# Patient Record
Sex: Female | Born: 1980 | Race: Asian | Hispanic: No | Marital: Married | Smoking: Former smoker
Health system: Southern US, Community
[De-identification: ages and names within clinical notes are randomized; demographics above are authoritative.]

## PROBLEM LIST (undated history)

## (undated) ENCOUNTER — Inpatient Hospital Stay (HOSPITAL_COMMUNITY): Payer: Self-pay

## (undated) DIAGNOSIS — F419 Anxiety disorder, unspecified: Secondary | ICD-10-CM

## (undated) DIAGNOSIS — Z8619 Personal history of other infectious and parasitic diseases: Secondary | ICD-10-CM

## (undated) DIAGNOSIS — R87619 Unspecified abnormal cytological findings in specimens from cervix uteri: Secondary | ICD-10-CM

## (undated) DIAGNOSIS — O139 Gestational [pregnancy-induced] hypertension without significant proteinuria, unspecified trimester: Secondary | ICD-10-CM

## (undated) HISTORY — DX: Gestational (pregnancy-induced) hypertension without significant proteinuria, unspecified trimester: O13.9

## (undated) HISTORY — DX: Anxiety disorder, unspecified: F41.9

---
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
## 1999-06-02 ENCOUNTER — Emergency Department (HOSPITAL_COMMUNITY): Admission: EM | Admit: 1999-06-02 | Discharge: 1999-06-02 | Payer: Self-pay | Admitting: Emergency Medicine

## 2002-05-25 ENCOUNTER — Inpatient Hospital Stay (HOSPITAL_COMMUNITY): Admission: AD | Admit: 2002-05-25 | Discharge: 2002-05-25 | Payer: Self-pay | Admitting: Family Medicine

## 2002-06-07 DIAGNOSIS — O139 Gestational [pregnancy-induced] hypertension without significant proteinuria, unspecified trimester: Secondary | ICD-10-CM

## 2002-06-07 HISTORY — PX: OOPHORECTOMY: SHX86

## 2002-06-07 HISTORY — DX: Gestational (pregnancy-induced) hypertension without significant proteinuria, unspecified trimester: O13.9

## 2002-07-18 ENCOUNTER — Ambulatory Visit (HOSPITAL_COMMUNITY): Admission: RE | Admit: 2002-07-18 | Discharge: 2002-07-18 | Payer: Self-pay | Admitting: *Deleted

## 2002-09-22 ENCOUNTER — Inpatient Hospital Stay (HOSPITAL_COMMUNITY): Admission: AD | Admit: 2002-09-22 | Discharge: 2002-09-22 | Payer: Self-pay | Admitting: *Deleted

## 2002-10-02 ENCOUNTER — Inpatient Hospital Stay (HOSPITAL_COMMUNITY): Admission: AD | Admit: 2002-10-02 | Discharge: 2002-10-02 | Payer: Self-pay | Admitting: Obstetrics and Gynecology

## 2002-10-08 ENCOUNTER — Encounter (INDEPENDENT_AMBULATORY_CARE_PROVIDER_SITE_OTHER): Payer: Self-pay | Admitting: Specialist

## 2002-10-08 ENCOUNTER — Encounter: Payer: Self-pay | Admitting: Obstetrics and Gynecology

## 2002-10-09 ENCOUNTER — Inpatient Hospital Stay (HOSPITAL_COMMUNITY): Admission: AD | Admit: 2002-10-09 | Discharge: 2002-10-11 | Payer: Self-pay | Admitting: Obstetrics and Gynecology

## 2002-10-12 ENCOUNTER — Inpatient Hospital Stay (HOSPITAL_COMMUNITY): Admission: AD | Admit: 2002-10-12 | Discharge: 2002-10-12 | Payer: Self-pay | Admitting: Family Medicine

## 2002-10-15 ENCOUNTER — Inpatient Hospital Stay (HOSPITAL_COMMUNITY): Admission: AD | Admit: 2002-10-15 | Discharge: 2002-10-15 | Payer: Self-pay | Admitting: Family Medicine

## 2002-11-06 ENCOUNTER — Encounter: Payer: Self-pay | Admitting: *Deleted

## 2002-11-06 ENCOUNTER — Inpatient Hospital Stay (HOSPITAL_COMMUNITY): Admission: AD | Admit: 2002-11-06 | Discharge: 2002-11-06 | Payer: Self-pay | Admitting: *Deleted

## 2002-11-07 ENCOUNTER — Inpatient Hospital Stay (HOSPITAL_COMMUNITY): Admission: AD | Admit: 2002-11-07 | Discharge: 2002-11-10 | Payer: Self-pay | Admitting: Obstetrics and Gynecology

## 2003-04-24 ENCOUNTER — Emergency Department (HOSPITAL_COMMUNITY): Admission: EM | Admit: 2003-04-24 | Discharge: 2003-04-24 | Payer: Self-pay | Admitting: Emergency Medicine

## 2003-07-20 ENCOUNTER — Emergency Department (HOSPITAL_COMMUNITY): Admission: EM | Admit: 2003-07-20 | Discharge: 2003-07-20 | Payer: Self-pay | Admitting: Emergency Medicine

## 2003-07-25 ENCOUNTER — Inpatient Hospital Stay (HOSPITAL_COMMUNITY): Admission: AD | Admit: 2003-07-25 | Discharge: 2003-07-25 | Payer: Self-pay | Admitting: Obstetrics & Gynecology

## 2003-08-27 ENCOUNTER — Inpatient Hospital Stay (HOSPITAL_COMMUNITY): Admission: AD | Admit: 2003-08-27 | Discharge: 2003-08-27 | Payer: Self-pay | Admitting: Family Medicine

## 2003-08-28 ENCOUNTER — Inpatient Hospital Stay (HOSPITAL_COMMUNITY): Admission: AD | Admit: 2003-08-28 | Discharge: 2003-08-28 | Payer: Self-pay | Admitting: Obstetrics and Gynecology

## 2003-09-18 ENCOUNTER — Inpatient Hospital Stay (HOSPITAL_COMMUNITY): Admission: AD | Admit: 2003-09-18 | Discharge: 2003-09-18 | Payer: Self-pay | Admitting: Obstetrics and Gynecology

## 2004-11-30 ENCOUNTER — Other Ambulatory Visit: Admission: RE | Admit: 2004-11-30 | Discharge: 2004-11-30 | Payer: Self-pay | Admitting: Obstetrics and Gynecology

## 2006-04-25 ENCOUNTER — Other Ambulatory Visit: Admission: RE | Admit: 2006-04-25 | Discharge: 2006-04-25 | Payer: Self-pay | Admitting: Obstetrics and Gynecology

## 2009-06-26 ENCOUNTER — Emergency Department (HOSPITAL_COMMUNITY): Admission: EM | Admit: 2009-06-26 | Discharge: 2009-06-26 | Payer: Self-pay | Admitting: Emergency Medicine

## 2010-08-23 LAB — URINALYSIS, ROUTINE W REFLEX MICROSCOPIC
Glucose, UA: NEGATIVE mg/dL
Hgb urine dipstick: NEGATIVE
Specific Gravity, Urine: 1.024 (ref 1.005–1.030)

## 2010-08-23 LAB — URINE MICROSCOPIC-ADD ON

## 2010-08-23 LAB — RPR: RPR Ser Ql: NONREACTIVE

## 2010-08-23 LAB — WET PREP, GENITAL: Yeast Wet Prep HPF POC: NONE SEEN

## 2010-10-23 NOTE — Discharge Summary (Signed)
NAMEGRACEYN, FODOR                             ACCOUNT NO.:  [id number]   MEDICAL RECORD NO.:  [id number]                   PATIENT TYPE:  INP   LOCATION:  9157                                 FACILITY:  [facility]   PHYSICIAN:  Phil D. Okey Dupre, M.D.                  DATE OF BIRTH:  [birth year]   DATE OF ADMISSION:  10/08/2002  DATE OF DISCHARGE:  10/11/2002                                 DISCHARGE SUMMARY   DISCHARGE DIAGNOSES:  1. Torsed right ovary with ovarian cysts.  2. Intrauterine pregnancy at 86 and 0/7 weeks.   DISCHARGE MEDICATIONS:  1. Percocet 5/325 one tablet p.o. q.4h. p.r.n. for pain.  2. Tylenol for mild to moderate pain.  3. Colace 100 mg p.o. daily.   DISPOSITION:  The patient was discharged home in stable condition.   FOLLOW UP:  1. Oct 12, 2002 patient to present to MAU to be monitored for one hour for     uterine contractions.  2. Oct 15, 2002 patient to present to MAU for staple removal.  3. The patient to call [facility] for follow-up appointment within two     weeks of discharge date.   HISTORY AND PHYSICAL:  The patient is a 30 year old G1, P0 who presented at  31 and 4/7 weeks dated by LMP complaining of constant right lower quadrant  pain.  The patient had vomiting x1, no dysuria, history of treated bacterial  vaginosis at the end of December.  She has been following at [facility]  beginning at 19 weeks for her prenatal care.   PHYSICAL EXAMINATION:  VITAL SIGNS:  Blood pressure 132/76.  GENERAL:  The patient was in severe discomfort.  ABDOMEN:  Soft with tenderness.  Unclear bowel sounds.  PELVIC:  Speculum examination showed small amount of thick white discharge  without blood.  Cervix on digital examination was posterior, closed, and  effaced to approximately 50%.  Fetal heart rate was in the 130s.  Tocolysis  strip showed uterine irritability.   LABORATORIES:  Wet prep that showed few white blood cells and few bacteria.  Ultrasound showed a  torsed right ovary without flow.   HOSPITAL COURSE:  Problem 1 - TORSION OF RIGHT OVARIAN CYST:  The patient  was taken for exploratory laparotomy by Javier Glazier. Okey Dupre, M.D.  Please see  dictated note for details.  The patient had a right oophorectomy.  Postoperative course was uncomplicated.  The patient was monitored for three  days for uterine contractions or other signs/symptoms of labor.  The patient  continued to show mild uterine irritability with occasional real contraction  on tocolysis strip that was stable throughout observation.  The patient is  to follow in 24 hours after the time of discharge for further monitoring.  Incision was clean, dry, and intact at the time of discharge.  The patient  to represent on May 10  for staple removal.   Problem 2 - INTRAUTERINE PREGNANCY AT 32 WEEKS AT THE TIME OF DISCHARGE:  Fetal external monitoring showed a reassuring tracing throughout the  patient's admission.  The patient to continue to follow at [facility]  for prenatal care.  As there are no signs and symptoms of labor, patient was  not placed on any tocolytics but plans were made to monitor the patient  intermittently.     Barney Drain, M.D.                       Phil D. Okey Dupre, M.D.    SS/MEDQ  D:  10/15/2002  T:  10/16/2002  Job:  [id number]

## 2010-10-23 NOTE — Op Note (Signed)
Courtney Woodward, Courtney Woodward                             ACCOUNT NO.:  [id number]   MEDICAL RECORD NO.:  [id number]                   PATIENT TYPE:  AMB   LOCATION:  SDC                                  [facility]  [facility]   PHYSICIAN:  Phil D. Okey Dupre, M.D.                  DATE OF BIRTH:  [birth year]   DATE OF PROCEDURE:  10/08/2002  DATE OF DISCHARGE:                                 OPERATIVE REPORT   PROCEDURE:  Exploratory laparotomy and right oophorectomy.   PREOPERATIVE DIAGNOSIS:  Right ovarian cyst torsion, 31+ weeks pregnant.   POSTOPERATIVE DIAGNOSIS:  Right ovarian cyst torsion, 31+ weeks pregnant.   SURGEON:  Javier Glazier. Okey Dupre, M.D.   ANESTHESIA:  General.   OPERATIVE FINDINGS:  A 6 x 6 x 6 very tense right ovarian cyst with a  torsion at the base.   DESCRIPTION OF PROCEDURE:  Under satisfactory general anesthesia the patient  with the patient in the dorsal supine position the abdomen was prepped and  draped in the usual sterile manner with a Foley catheter in the urinary  bladder.  The abdomen was entered through a midline subumbilical incision  situated just below the umbilicus and extending caudad for approximately 8  cm.  The abdomen was entered by layers.  Entering the peritoneal cavity we  immediately reached for the right adnexa and this was as described above,  and just exteriorizing it through the incision the tension of the cyst broke  and the cyst deflated with amber serous fluid extruded from the cyst.  In  looking at the base of the cyst it appeared not to be viable, and in this  patient who was already pregnant we felt that it would be too much of a risk  to repair this cyst and leave nonviable tissue in there.  Heaney clamps were  placed across the base of the cyst, making sure to stay free of the  fallopian tube on that side and the ovary was then removed and the pedicles  ligated doubly with suture ligature of 0 Vicryl.  The area was observed for  bleeding; none was  noted and the pack was replaced into the peritoneal  cavity and the incision closed fascia through peritoneum with 0 Vicryl  alternating locked suture.  Subcutaneous approximation was carried out with  2-0 plain catgut suture and skin edges approximated with skin staples.  Subcutaneous bleeders were controlled with cautery.  Dry sterile dressing  was applied.  The patient was transferred to recovery room in satisfactory  condition with a minimal blood loss of less than 20 mL.  Tape, instrument,  sponge, and needle count reported correct at the end of the procedure.  Phil D. Okey Dupre, M.D.    PDR/MEDQ  D:  10/08/2002  T:  10/08/2002  Job:  [id number]

## 2010-10-23 NOTE — Discharge Summary (Signed)
Courtney Woodward, Courtney Woodward                             ACCOUNT NO.:  [id number]   MEDICAL RECORD NO.:  [id number]                   PATIENT TYPE:  INP   LOCATION:  9106                                 [facility]ILITY:  [facility]   PHYSICIAN:  Rodolph Bong, M.D.                  DATE OF BIRTH:  1980/07/12   DATE OF ADMISSION:  11/07/2002  DATE OF DISCHARGE:  11/10/2002                                 DISCHARGE SUMMARY   DISCHARGE DIAGNOSES:  1. Normal spontaneous vaginal delivery of an intrauterine pregnancy at 35-     5/7 weeks.  2. Pre-eclampsia.   DISCHARGE MEDICATIONS:  1. Ibuprofen 600 mg p.o. every 6 hours p.r.n.  2. Prenatal vitamins one tablet p.o. q.d. for six weeks.  3. Depo-Provera 150 mg IM injection x1. The patient instructed to pursue     each 3 months for continued birth control.   HISTORY OF PRESENT ILLNESS:  This is a 30 year old G1, P0 who presented at  35-6/7 weeks with complaints of vomiting and questionable rupture of  membranes early this morning. She was found to be pre-eclamptic with a uric  acid level of 5.9 and blood pressure elevation to 160's over 90's.  Consequently, she was admitted to antepartum for further evaluation and  management.   HOSPITAL COURSE:  1. Obstetrics. The patient was admitted to the antepartum for further     evaluation. She was started on magnesium sulfate therapy. Over the next     few hours, she continued to have contractions which increased in     frequency. She was found to have cervical dilatation as well. At 2230 on     November 07, 2002 the patient delivered a viable female infant by SVD. She had     had complete vaginal dilatation and there were some fetal variable     decelerations in the 60's with heart rate to 100 to 120's. She had a     primary second degree laceration that did require suturing with 3-0     Vicryl. The baby did have a nuchal cord x1. Cord clamped and the baby     passed to the NICU team for evaluation. There were no other  complications. The baby did have Apgar's of 8 at one minute and 9 at five     minutes. Throughout the remainder of her hospital stay, [name] did     well. She was maintained on magnesium sulfate therapy for approximately     24 hours post delivery. Then as she began diuresing, the magnesium     therapy was stopped. She was monitored for another 24 hours without any     further complications. Her blood pressure had trended down and was in the     120 over 60's at discharge.   LABORATORY DATA:  Discharge labs, hemoglobin of 11.6, hematocrit 34.5 on  November 08, 2002.  DISCHARGE INSTRUCTIONS:  Sexual activity, the patient was advised for no  sexual intercourse for six weeks.   FOLLOW UP:  The patient advised to return to [facility] in six weeks for  further evaluation.                                               Rodolph Bong, M.D.    AK/MEDQ  D:  11/10/2002  T:  11/10/2002  Job:  [id number]

## 2015-03-21 ENCOUNTER — Ambulatory Visit (INDEPENDENT_AMBULATORY_CARE_PROVIDER_SITE_OTHER): Payer: BLUE CROSS/BLUE SHIELD | Admitting: Obstetrics and Gynecology

## 2015-03-21 ENCOUNTER — Encounter: Payer: Self-pay | Admitting: Obstetrics and Gynecology

## 2015-03-21 VITALS — BP 108/70 | HR 60 | Resp 18 | Ht 68.0 in | Wt 258.8 lb

## 2015-03-21 DIAGNOSIS — N760 Acute vaginitis: Secondary | ICD-10-CM

## 2015-03-21 NOTE — Addendum Note (Signed)
Addended by: Ardell IsaacsAMUNDSON C SILVA, BROOK E on: 03/21/2015 10:49 AM   Modules accepted: Orders

## 2015-03-21 NOTE — Progress Notes (Signed)
Patient ID: Courtney Woodward, female   DOB: July 13, 1980, 34 y.o.   MRN: [id number] 34 y.o. G3P0 Married [place] female here for vaginal discharge with odor. Patient states she has been treated several times with Metronidazole by Urgent Care, but continues to have symptoms. Symptoms off and on for 6 months.  Having clear white discharge with and without odor.  No itching or burning.  Feels like she has never been symptoms free.  Taking cranberry pill and probiotics.   Not helping.  Stopped douching on year ago.  No change in partner.  Uses condoms some of the time.  Not using any gels, creams or lotions.   Had GC/CT and full STD testing which was negative.   Did Metrogel twice.  Took Flagyl 2 weeks ago and a couple of months ago.   Menses regular and normal.    PCP:   None  Patient's last menstrual period was 02/25/2015 (exact date).          Sexually active: Yes.   female The current method of family planning is condoms sometimes.    Exercising: Yes.    works out at gym 3-4x/week. Smoker:  Former, quit in 2003  Health Maintenance: Pap:  08-30-14 normal per patient History of abnormal Pap:  no MMG:  n/a Colonoscopy:  n/a BMD:   n/a  Result  n/a TDaP:  UNSURE     reports that she quit smoking about 13 years ago. She does not have any smokeless tobacco history on file. She reports that she drinks alcohol. She reports that she does not use illicit drugs.  Past Medical History  Diagnosis Date  . Anxiety   . PIH (pregnancy induced hypertension) 2004    Past Surgical History  Procedure Laterality Date  . Oophorectomy  2004    Rt. ovary removed--Hemorrhagic Corpus Luteum Cyst    Current Outpatient Prescriptions  Medication Sig Dispense Refill  . triamcinolone (KENALOG) 0.025 % cream apply to FACE twice a day if needed  0   No current facility-administered medications for this visit.    Family History  Problem Relation Age of Onset  . Stroke Mother     Hx of 3 strokes  .  Cancer Mother     Dec unknown Cancer age 75  . Diabetes Mother   . Hyperlipidemia Mother     ROS:  Pertinent items are noted in HPI.  Otherwise, a comprehensive ROS was negative.  Exam:   BP 108/70 mmHg  Pulse 60  Resp 18  Ht  (1.727 m)  Wt 258 lb 12.8 oz (117.391 kg)  BMI 39.36 kg/m2  LMP 02/25/2015 (Exact Date)    General appearance: alert, cooperative and appears stated age   Abdomen: soft, non-tender; bowel sounds normal; no masses,  no organomegaly    Pelvic: External genitalia:  no lesions              Urethra:  normal appearing urethra with no masses, tenderness or lesions              Bartholins and Skenes: normal                 Vagina: normal appearing vagina with normal color and discharge, no lesions              Cervix: no lesions         Bimanual Exam:  Uterus:  normal size, contour, position, consistency, mobility, non-tender  Adnexa: normal adnexa and no mass, fullness, tenderness               Chaperone was present for exam.  Assessment:     Recurrent vaginitis.  Hx bacterial vaginosis.   Plan:   Affirm testing performed.  Discussion of bacterial vaginosis, risk factors, acute treatment options (may do Tindamax this time if needed) and long term treatment options - boric acid vaginal suppositories or Metrogel. Written materials on vaginitis also provided.  After visit summary provided.   ___20____ minutes face to face time of which over 50% was spent in counseling.

## 2015-03-21 NOTE — Patient Instructions (Signed)
Bacterial Vaginosis °Bacterial vaginosis is a vaginal infection that occurs when the normal balance of bacteria in the vagina is disrupted. It results from an overgrowth of certain bacteria. This is the most common vaginal infection in women of childbearing age. Treatment is important to prevent complications, especially in pregnant women, as it can cause a premature delivery. °CAUSES  °Bacterial vaginosis is caused by an increase in harmful bacteria that are normally present in smaller amounts in the vagina. Several different kinds of bacteria can cause bacterial vaginosis. However, the reason that the condition develops is not fully understood. °RISK FACTORS °Certain activities or behaviors can put you at an increased risk of developing bacterial vaginosis, including: °· Having a new sex partner or multiple sex partners. °· Douching. °· Using an intrauterine device (IUD) for contraception. °Women do not get bacterial vaginosis from toilet seats, bedding, swimming pools, or contact with objects around them. °SIGNS AND SYMPTOMS  °Some women with bacterial vaginosis have no signs or symptoms. Common symptoms include: °· Grey vaginal discharge. °· A fishlike odor with discharge, especially after sexual intercourse. °· Itching or burning of the vagina and vulva. °· Burning or pain with urination. °DIAGNOSIS  °Your health care provider will take a medical history and examine the vagina for signs of bacterial vaginosis. A sample of vaginal fluid may be taken. Your health care provider will look at this sample under a microscope to check for bacteria and abnormal cells. A vaginal pH test may also be done.  °TREATMENT  °Bacterial vaginosis may be treated with antibiotic medicines. These may be given in the form of a pill or a vaginal cream. A second round of antibiotics may be prescribed if the condition comes back after treatment. Because bacterial vaginosis increases your risk for sexually transmitted diseases, getting  treated can help reduce your risk for chlamydia, gonorrhea, HIV, and herpes. °HOME CARE INSTRUCTIONS  °· Only take over-the-counter or prescription medicines as directed by your health care provider. °· If antibiotic medicine was prescribed, take it as directed. Make sure you finish it even if you start to feel better. °· Tell all sexual partners that you have a vaginal infection. They should see their health care provider and be treated if they have problems, such as a mild rash or itching. °· During treatment, it is important that you follow these instructions: °¨ Avoid sexual activity or use condoms correctly. °¨ Do not douche. °¨ Avoid alcohol as directed by your health care provider. °¨ Avoid breastfeeding as directed by your health care provider. °SEEK MEDICAL CARE IF:  °· Your symptoms are not improving after 3 days of treatment. °· You have increased discharge or pain. °· You have a fever. °MAKE SURE YOU:  °· Understand these instructions. °· Will watch your condition. °· Will get help right away if you are not doing well or get worse. °FOR MORE INFORMATION  °Centers for Disease Control and Prevention, Division of STD Prevention: www.cdc.gov/std °American Sexual Health Association (ASHA): www.ashastd.org  °  °This information is not intended to replace advice given to you by your health care provider. Make sure you discuss any questions you have with your health care provider. °  °Document Released: 05/24/2005 Document Revised: 06/14/2014 Document Reviewed: 01/03/2013 °Elsevier Interactive Patient Education ©2016 Elsevier Inc. ° ° ° °Vaginitis °Vaginitis is an inflammation of the vagina. It is most often caused by a change in the normal balance of the bacteria and yeast that live in the vagina. This change in balance   causes an overgrowth of certain bacteria or yeast, which causes the inflammation. There are different types of vaginitis, but the most common types are: °· Bacterial vaginosis. °· Yeast  infection (candidiasis). °· Trichomoniasis vaginitis. This is a sexually transmitted infection (STI). °· Viral vaginitis. °· Atrophic vaginitis. °· Allergic vaginitis. °CAUSES  °The cause depends on the type of vaginitis. Vaginitis can be caused by: °· Bacteria (bacterial vaginosis). °· Yeast (yeast infection). °· A parasite (trichomoniasis vaginitis) °· A virus (viral vaginitis). °· Low hormone levels (atrophic vaginitis). Low hormone levels can occur during pregnancy, breastfeeding, or after menopause. °· Irritants, such as bubble baths, scented tampons, and feminine sprays (allergic vaginitis). °Other factors can change the normal balance of the yeast and bacteria that live in the vagina. These include: °· Antibiotic medicines. °· Poor hygiene. °· Diaphragms, vaginal sponges, spermicides, birth control pills, and intrauterine devices (IUD). °· Sexual intercourse. °· Infection. °· Uncontrolled diabetes. °· A weakened immune system. °SYMPTOMS  °Symptoms can vary depending on the cause of the vaginitis. Common symptoms include: °· Abnormal vaginal discharge. °¨ The discharge is white, gray, or yellow with bacterial vaginosis. °¨ The discharge is thick, white, and cheesy with a yeast infection. °¨ The discharge is frothy and yellow or greenish with trichomoniasis. °· A bad vaginal odor. °¨ The odor is fishy with bacterial vaginosis. °· Vaginal itching, pain, or swelling. °· Painful intercourse. °· Pain or burning when urinating. °Sometimes, there are no symptoms. °TREATMENT  °Treatment will vary depending on the type of infection.  °· Bacterial vaginosis and trichomoniasis are often treated with antibiotic creams or pills. °· Yeast infections are often treated with antifungal medicines, such as vaginal creams or suppositories. °· Viral vaginitis has no cure, but symptoms can be treated with medicines that relieve discomfort. Your sexual partner should be treated as well. °· Atrophic vaginitis may be treated with an  estrogen cream, pill, suppository, or vaginal ring. If vaginal dryness occurs, lubricants and moisturizing creams may help. You may be told to avoid scented soaps, sprays, or douches. °· Allergic vaginitis treatment involves quitting the use of the product that is causing the problem. Vaginal creams can be used to treat the symptoms. °HOME CARE INSTRUCTIONS  °· Take all medicines as directed by your caregiver. °· Keep your genital area clean and dry. Avoid soap and only rinse the area with water. °· Avoid douching. It can remove the healthy bacteria in the vagina. °· Do not use tampons or have sexual intercourse until your vaginitis has been treated. Use sanitary pads while you have vaginitis. °· Wipe from front to back. This avoids the spread of bacteria from the rectum to the vagina. °· Let air reach your genital area. °¨ Wear cotton underwear to decrease moisture buildup. °¨ Avoid wearing underwear while you sleep until your vaginitis is gone. °¨ Avoid tight pants and underwear or nylons without a cotton panel. °¨ Take off wet clothing (especially bathing suits) as soon as possible. °· Use mild, non-scented products. Avoid using irritants, such as: °¨ Scented feminine sprays. °¨ Fabric softeners. °¨ Scented detergents. °¨ Scented tampons. °¨ Scented soaps or bubble baths. °· Practice safe sex and use condoms. Condoms may prevent the spread of trichomoniasis and viral vaginitis. °SEEK MEDICAL CARE IF:  °· You have abdominal pain. °· You have a fever or persistent symptoms for more than 2-3 days. °· You have a fever and your symptoms suddenly get worse. °  °This information is not intended to replace advice given   to you by your health care provider. Make sure you discuss any questions you have with your health care provider. °  °Document Released: 03/21/2007 Document Revised: 10/08/2014 Document Reviewed: 11/04/2011 °Elsevier Interactive Patient Education ©2016 Elsevier Inc. ° °

## 2015-03-22 LAB — WET PREP BY MOLECULAR PROBE
Candida species: POSITIVE — AB
GARDNERELLA VAGINALIS: POSITIVE — AB
TRICHOMONAS VAG: NEGATIVE

## 2015-03-25 ENCOUNTER — Telehealth: Payer: Self-pay | Admitting: Obstetrics and Gynecology

## 2015-03-25 MED ORDER — FLUCONAZOLE 150 MG PO TABS: 150.0000 mg | ORAL_TABLET | Freq: Every day | ORAL | Status: DC

## 2015-03-25 MED ORDER — TINIDAZOLE 500 MG PO TABS: 500.0000 mg | ORAL_TABLET | Freq: Two times a day (BID) | ORAL | Status: DC

## 2015-03-25 MED ORDER — NONFORMULARY OR COMPOUNDED ITEM: Status: DC

## 2015-03-25 NOTE — Telephone Encounter (Signed)
Spoke with patient. Advised of results and message as seen below from Dr.Silva. Patient is agreeable. Rx for Diflucan 150 mg #2 0RF and Tindamax 500 mg #10 [facility]RF [facility] patient request. Rx for Boric Acid suppositories 600 mg per vagina 21 days #12 0RF [facility].   Notes Recorded by Patton SallesBrook E Amundson C Silva, MD on 03/22/2015 at 1:30 PM Please report results to patient showing both yeast vaginitis and bacterial vaginosis.  I am recommending patient take: Diflucan 150 mg po. Take 1 and repeat in 48 hours prn. #2, RF zero.  Tindamax 500 mg tablets. Take 2 tablets (1000 mg) po q day for 5 days. #10, RF zero.  Following treatmentof the yeast and bacterial vaginosis, patient may use boric acid vaginal suppositories 600 mg per vagina for 21 days. #21, RF zero. This will need to go to compounding pharmacy [facility]. Follow up prn.   Cc- Claudette LawsAmanda Dixon  Routing to provider for final review. Patient agreeable to disposition. Will close encounter.

## 2015-03-25 NOTE — Telephone Encounter (Signed)
Patient calling for results.

## 2017-11-05 DIAGNOSIS — R87619 Unspecified abnormal cytological findings in specimens from cervix uteri: Secondary | ICD-10-CM

## 2017-11-05 HISTORY — DX: Unspecified abnormal cytological findings in specimens from cervix uteri: R87.619

## 2017-11-05 HISTORY — PX: LEEP: SHX91

## 2017-12-05 DIAGNOSIS — Z8619 Personal history of other infectious and parasitic diseases: Secondary | ICD-10-CM

## 2017-12-05 HISTORY — DX: Personal history of other infectious and parasitic diseases: Z86.19

## 2018-01-05 ENCOUNTER — Emergency Department (HOSPITAL_COMMUNITY)
Admission: EM | Admit: 2018-01-05 | Discharge: 2018-01-06 | Disposition: A | Discharge status: Home / Self Care | Payer: BLUE CROSS/BLUE SHIELD | Attending: Emergency Medicine | Admitting: Emergency Medicine

## 2018-01-05 ENCOUNTER — Emergency Department (HOSPITAL_COMMUNITY): Payer: BLUE CROSS/BLUE SHIELD

## 2018-01-05 ENCOUNTER — Encounter (HOSPITAL_COMMUNITY): Payer: Self-pay | Admitting: Emergency Medicine

## 2018-01-05 DIAGNOSIS — Z87891 Personal history of nicotine dependence: Secondary | ICD-10-CM | POA: Diagnosis not present

## 2018-01-05 DIAGNOSIS — R079 Chest pain, unspecified: Secondary | ICD-10-CM | POA: Insufficient documentation

## 2018-01-05 DIAGNOSIS — R0602 Shortness of breath: Secondary | ICD-10-CM | POA: Diagnosis not present

## 2018-01-05 LAB — BASIC METABOLIC PANEL
ANION GAP: 9 (ref 5–15)
BUN: 22 mg/dL — ABNORMAL HIGH (ref 6–20)
CHLORIDE: 109 mmol/L (ref 98–111)
CO2: 24 mmol/L (ref 22–32)
Calcium: 9 mg/dL (ref 8.9–10.3)
Creatinine, Ser: 0.72 mg/dL (ref 0.44–1.00)
GFR calc non Af Amer: >60 mL/min (ref >60)
GLUCOSE: 95 mg/dL (ref 70–99)
Potassium: 3.9 mmol/L (ref 3.5–5.1)
Sodium: 142 mmol/L (ref 135–145)

## 2018-01-05 LAB — I-STAT TROPONIN, ED
TROPONIN I, POC: 0.01 ng/mL (ref 0.00–0.08)
Troponin i, poc: 0 ng/mL (ref 0.00–0.08)

## 2018-01-05 LAB — I-STAT BETA HCG BLOOD, ED (MC, WL, AP ONLY): I-stat hCG, quantitative: <5 m[IU]/mL (ref <5)

## 2018-01-05 LAB — CBC
HCT: 39.7 % (ref 36.0–46.0)
Hemoglobin: 13.2 g/dL (ref 12.0–15.0)
MCH: 30.2 pg (ref 26.0–34.0)
MCHC: 33.2 g/dL (ref 30.0–36.0)
MCV: 90.8 fL (ref 78.0–100.0)
Platelets: 277 10*3/uL (ref 150–400)
RBC: 4.37 MIL/uL (ref 3.87–5.11)
RDW: 13.5 % (ref 11.5–15.5)
WBC: 9.8 10*3/uL (ref 4.0–10.5)

## 2018-01-05 MED ORDER — GI COCKTAIL ~~LOC~~
30.0000 mL | Freq: Once | ORAL | Status: AC
  Administered 2018-01-05: 30 mL via ORAL
  Filled 2018-01-05: qty 30

## 2018-01-05 NOTE — ED Provider Notes (Signed)
[facility]-EMERGENCY DEPT Provider Note   CSN: [id number] Arrival date & time: 01/05/18  1946     History   Chief Complaint Chief Complaint  Patient presents with  . Chest Pain    HPI Courtney Woodward is a 37 y.o. female presenting for evaluation of chest pain.  Patient states she was working out and drink an energy drink.  She continued to work out, when she had acute onset of chest pain or shortness of breath.  By the time she got to the hospital, her symptoms had mostly resolved.  States she had a similar episode in March after she had been drinking a significant amount of alcohol.  She denies recent alcohol use.  She states pain was in her central chest, describes it as a tightness.  It did not radiate.  She denies associated diaphoresis, nausea, or vomiting.  She has no medical problems, no history of diabetes, hypertension, hyperlipidemia.  No history of cardiac or pulmonary problems in the past.  She recently started Flagyl for GU infections, has not had any side effects with this including vomiting or diarrhea.  Symptoms are not worse with exertion or inspiration.  They have continued to improve as time has passed.  Currently pain-free.  HPI  Past Medical History:  Diagnosis Date  . Anxiety   . PIH (pregnancy induced hypertension) 2004    There are no active problems to display for this patient.   Past Surgical History:  Procedure Laterality Date  . OOPHORECTOMY  2004   Rt. ovary removed--Hemorrhagic Corpus Luteum Cyst     OB History    Gravida  3   Para  2   Term  2   Preterm  0   AB  1   Living  2     SAB  0   TAB  1   Ectopic  0   Multiple  1   Live Births  2            Home Medications    Prior to Admission medications   Medication Sig Start Date End Date Taking? Authorizing Provider  metroNIDAZOLE (FLAGYL) 500 MG tablet Take 500 mg by mouth 2 (two) times daily.   Yes [provider]  fluconazole (DIFLUCAN)  150 MG tablet Take 1 tablet (150 mg total) by mouth daily. Repeat in 48 hours. Patient not taking: Reported on 01/05/2018 03/25/15   Patton Salles, MD  NONFORMULARY OR COMPOUNDED ITEM Place one boric acid vaginal suppository 600 mg per vagina nightly for 21 days. Patient not taking: Reported on 01/05/2018 03/25/15   Patton Salles, MD  tinidazole (TINDAMAX) 500 MG tablet Take 1 tablet (500 mg total) by mouth 2 (two) times daily. Patient not taking: Reported on 01/05/2018 03/25/15   Patton Salles, MD    Family History Family History  Problem Relation Age of Onset  . Stroke Mother        Hx of 3 strokes  . Cancer Mother        Dec unknown Cancer age 10  . Diabetes Mother   . Hyperlipidemia Mother     Social History Social History   Tobacco Use  . Smoking status: Former Smoker    Last attempt to quit: 06/07/2001    Years since quitting: 16.5  Substance Use Topics  . Alcohol use: Yes    Alcohol/week: 0.0 oz    Comment: only occ.  . Drug use:  No     Allergies   Patient has no known allergies.   Review of Systems Review of Systems  Respiratory: Positive for chest tightness and shortness of breath.   Cardiovascular: Positive for chest pain.  All other systems reviewed and are negative.    Physical Exam Updated Vital Signs BP (!) 142/85   Pulse 63   Temp 98.2 F (36.8 C) (Oral)   Resp (!) 21   Ht 5\' 7"  (1.702 m)   Wt 113.4 kg (250 lb)   LMP 12/12/2017   SpO2 98%   BMI 39.16 kg/m   Physical Exam  Constitutional: She is oriented to person, place, and time. She appears well-developed and well-nourished. No distress.  Appears in no distress  HENT:  Head: Normocephalic and atraumatic.  Eyes: Pupils are equal, round, and reactive to light. Conjunctivae and EOM are normal.  Neck: Normal range of motion. Neck supple.  Cardiovascular: Normal rate, regular rhythm and intact distal pulses.  Pulmonary/Chest: Effort normal and breath sounds  normal. No respiratory distress. She has no wheezes.  Abdominal: Soft. She exhibits no distension and no mass. There is no tenderness. There is no guarding.  Musculoskeletal: Normal range of motion.  No leg pain or swelling.  Pedal pulses intact bilaterally  Neurological: She is alert and oriented to person, place, and time.  Skin: Skin is warm and dry. Capillary refill takes less than 2 seconds.  Psychiatric: She has a normal mood and affect.  Nursing note and vitals reviewed.    ED Treatments / Results  Labs (all labs ordered are listed, but only abnormal results are displayed) Labs Reviewed  BASIC METABOLIC PANEL - Abnormal; Notable for the following components:      Result Value   BUN 22 (*)    All other components within normal limits  CBC  I-STAT TROPONIN, ED  I-STAT BETA HCG BLOOD, ED ([facility], [facility], [facility] ONLY)  I-STAT TROPONIN, ED    EKG EKG Interpretation  Date/Time:  Thursday January 05 2018 20:06:40 EDT Ventricular Rate:  74 PR Interval:    QRS Duration: 93 QT Interval:  413 QTC Calculation: 459 R Axis:   49 Text Interpretation:  Sinus rhythm Low voltage, precordial leads No previous ECGs available Confirmed by Vanetta MuldersZackowski, Scott [phone]([id number]) on 01/05/2018 11:40:44 PM   Radiology Dg Chest 2 View  Result Date: 01/05/2018 CLINICAL DATA:  Mid chest pain. EXAM: CHEST - 2 VIEW COMPARISON:  None. FINDINGS: Mildly enlarged cardiac silhouette. Mediastinal contours appear intact. There is no evidence of focal airspace consolidation, pleural effusion or pneumothorax. Osseous structures are without acute abnormality. Soft tissues are grossly normal. IMPRESSION: Mildly enlarged cardiac silhouette. No evidence of pulmonary edema or consolidation. Electronically Signed   By: Ted Mcalpineobrinka  Dimitrova M.D.   On: 01/05/2018 20:17    Procedures Procedures (including critical care time)  Medications Ordered in ED Medications  gi cocktail (Maalox,Lidocaine,Donnatal) (30 mLs Oral Given 01/05/18 2256)      Initial Impression / Assessment and Plan / ED Course  I have reviewed the triage vital signs and the nursing notes.  Pertinent labs & imaging results that were available during my care of the patient were reviewed by me and considered in my medical decision making (see chart for details).     Pt presented for evaluation of chest pain or shortness of breath.  Physical exam reassuring, she is currently symptom-free.  She appears in no distress.  Initial labs reassuring, troponin negative, EKG without STEMI.  Thus doubt ACS.  Chest x-ray viewed interpreted by me, shows mild cardiomegaly without pneumothorax, pneumonia, or effusions.  Patient without peripheral edema and with resolution of symptoms, doubt new onset CHF.  Labs otherwise reassuring.  Will obtain repeat troponin, try GI cocktail for symptom relief, and reassess.  On reassessment, patient reports complete resolution of her symptoms.  Repeat troponin negative.  Patient is low risk, heart score 0.  Thus doubt ACS, PE, or concerning etiology of chest pain requiring hospitalization.  Discussed findings with patient.  Discussed follow-up with PCP if symptoms return or persist.  Strict return precautions given.  At this time, patient appears safe for discharge.  Patient states she understands and agrees plan.  Final Clinical Impressions(s) / ED Diagnoses   Final diagnoses:  Nonspecific chest pain    ED Discharge Orders    None       Miyo, Aina, PA-C 01/05/18 2344    Vanetta Mulders, MD 01/06/18 [phone]

## 2018-01-05 NOTE — Discharge Instructions (Addendum)
Stop using caffeinated products, including energy drinks, coffee, tea, sodas. Make sure you are staying well-hydrated with water. Follow-up with your primary care doctor if your symptoms continue. Return to the emergency room if you develop any new, worsening, or concerning symptoms.

## 2018-01-05 NOTE — ED Triage Notes (Signed)
Patient reports drinking energy drink approximately 30 minutes ago. Reports central chest pain and SOB that began while working out 15 minutes ago. Denies N/V, pain radiation.

## 2018-01-05 NOTE — ED Notes (Signed)
Pt stated that she drank a bang energy drink and was working out when she suddenly couldn't take a deep breath, she developed hives and was sneezing uncontrollably. She is just c/o some chest tightness currently.

## 2018-03-21 ENCOUNTER — Inpatient Hospital Stay (HOSPITAL_COMMUNITY): Payer: BLUE CROSS/BLUE SHIELD

## 2018-03-21 ENCOUNTER — Encounter (HOSPITAL_COMMUNITY): Payer: Self-pay | Admitting: Student

## 2018-03-21 ENCOUNTER — Inpatient Hospital Stay (HOSPITAL_COMMUNITY)
Admission: AD | Admit: 2018-03-21 | Discharge: 2018-03-21 | Disposition: A | Discharge status: Home / Self Care | Payer: BLUE CROSS/BLUE SHIELD | Source: Ambulatory Visit | Attending: Obstetrics | Admitting: Obstetrics

## 2018-03-21 ENCOUNTER — Other Ambulatory Visit: Payer: Self-pay

## 2018-03-21 DIAGNOSIS — R1032 Left lower quadrant pain: Secondary | ICD-10-CM | POA: Diagnosis present

## 2018-03-21 DIAGNOSIS — M25552 Pain in left hip: Secondary | ICD-10-CM

## 2018-03-21 DIAGNOSIS — Z87891 Personal history of nicotine dependence: Secondary | ICD-10-CM | POA: Insufficient documentation

## 2018-03-21 DIAGNOSIS — Z3A10 10 weeks gestation of pregnancy: Secondary | ICD-10-CM | POA: Diagnosis not present

## 2018-03-21 DIAGNOSIS — O26891 Other specified pregnancy related conditions, first trimester: Secondary | ICD-10-CM | POA: Diagnosis not present

## 2018-03-21 DIAGNOSIS — R109 Unspecified abdominal pain: Secondary | ICD-10-CM

## 2018-03-21 HISTORY — DX: Personal history of other infectious and parasitic diseases: Z86.19

## 2018-03-21 HISTORY — DX: Unspecified abnormal cytological findings in specimens from cervix uteri: R87.619

## 2018-03-21 LAB — URINALYSIS, ROUTINE W REFLEX MICROSCOPIC
BILIRUBIN URINE: NEGATIVE
Glucose, UA: NEGATIVE mg/dL
Hgb urine dipstick: NEGATIVE
Ketones, ur: NEGATIVE mg/dL
Nitrite: NEGATIVE
Protein, ur: NEGATIVE mg/dL
SPECIFIC GRAVITY, URINE: 1.015 (ref 1.005–1.030)
pH: 5 (ref 5.0–8.0)

## 2018-03-21 LAB — WET PREP, GENITAL
Clue Cells Wet Prep HPF POC: NONE SEEN
SPERM: NONE SEEN
Trich, Wet Prep: NONE SEEN
YEAST WET PREP: NONE SEEN

## 2018-03-21 LAB — POCT PREGNANCY, URINE: PREG TEST UR: POSITIVE — AB

## 2018-03-21 MED ORDER — ACETAMINOPHEN 500 MG PO TABS
1000.0000 mg | ORAL_TABLET | Freq: Once | ORAL | Status: AC
  Administered 2018-03-21: 1000 mg via ORAL
  Filled 2018-03-21: qty 2

## 2018-03-21 NOTE — MAU Provider Note (Signed)
Chief Complaint: Abdominal Pain and Hip Pain   First Provider Initiated Contact with Patient 03/21/18 0408     SUBJECTIVE HPI: Courtney Woodward is a 37 y.o. [id number] at [redacted]w[redacted]d who presents to Maternity Admissions reporting LLQ pain. Pain woke her up from sleep this morning. Pain is in LLQ & left hip, radiates down left leg. Denies n/v/d, dysuria, vaginal bleeding, or vaginal discharge.  Went to initial OB appt at [facility] ob[facility]. States she now has secondary medicaid and will be switching ob/gyns at the end of the month.  Has hx of right oophorectomy d/t hemorrhagic cyst.  Hx of trich treated in July.   Location: left hip Quality: sharp, shooting Severity: 7/10 on pain scale Duration: 3 hours Timing: constant.  Modifying factors: pain radiates down left leg. Worse with walking. Hasn't tx symptoms.  Associated signs and symptoms: none  Past Medical History:  Diagnosis Date  . 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 Anxiety   . Hx of trichomoniasis 12/2017  . PIH (pregnancy induced hypertension) 2004   OB History  Gravida Para Term Preterm AB Living  4 2 2  0 1 2  SAB TAB Ectopic Multiple Live Births  0 1 0 1 2    # Outcome Date GA Lbr Len/2nd Weight Sex Delivery Anes PTL Lv  4 Current           3A Term 2005 [redacted]w[redacted]d   F Vag-Spont   LIV  3B Term 2005 [redacted]w[redacted]d   F Vag-Spont   FD     Complications: Fetal demise, less than 22 weeks  2 Term 2004 [redacted]w[redacted]d   M Vag-Spont   LIV  1 TAB            Past Surgical History:  Procedure Laterality Date  . LEEP  11/2017  . OOPHORECTOMY  2004   Rt. ovary removed--Hemorrhagic Corpus Luteum Cyst   Social History   Socioeconomic History  . Marital status: Married    Spouse name: Not on file  . Number of children: Not on file  . Years of education: Not on file  . Highest education level: Not on file  Occupational History  . Not on file  Social Needs  . Financial resource strain: Not on file  . Food insecurity:    Worry: Not on file     Inability: Not on file  . Transportation needs:    Medical: Not on file    Non-medical: Not on file  Tobacco Use  . Smoking status: Former Smoker    Last attempt to quit: 06/07/2001    Years since quitting: 16.7  Substance and Sexual Activity  . Alcohol use: Not Currently    Alcohol/week: 0.0 standard drinks    Comment: only occ.  . Drug use: No  . Sexual activity: Yes    Partners: Male    Birth control/protection: Condom    Comment: Condoms sometimes  Lifestyle  . Physical activity:    Days per week: Not on file    Minutes per session: Not on file  . Stress: Not on file  Relationships  . Social connections:    Talks on phone: Not on file    Gets together: Not on file    Attends religious service: Not on file    Active member of club or organization: Not on file    Attends meetings of clubs or organizations: Not on file    Relationship status: Not on file  .  Intimate partner violence:    Fear of current or ex partner: Not on file    Emotionally abused: Not on file    Physically abused: Not on file    Forced sexual activity: Not on file  Other Topics Concern  . Not on file  Social History Narrative  . Not on file   Family History  Problem Relation Age of Onset  . Stroke Mother        Hx of 3 strokes  . Cancer Mother        Dec unknown Cancer age 28  . Diabetes Mother   . Hyperlipidemia Mother    No current facility-administered medications on file prior to encounter.    Current Outpatient Medications on File Prior to Encounter  Medication Sig Dispense Refill  . Prenatal Vit-Fe Fumarate-FA (PRENATAL MULTIVITAMIN) TABS tablet Take 1 tablet by mouth daily at 12 noon.     No Known Allergies  I have reviewed patient's Past Medical Hx, Surgical Hx, Family Hx, Social Hx, medications and allergies.   Review of Systems  Constitutional: Negative.   Gastrointestinal: Positive for abdominal pain. Negative for diarrhea, nausea and vomiting.  Genitourinary: Negative.    Musculoskeletal: Negative for back pain.       + hip pain    OBJECTIVE Patient Vitals for the past 24 hrs:  BP Temp Temp src Pulse Resp Height Weight  03/21/18 0608 (!) 116/59 98 F (36.7 C) Oral 63 16 - -  03/21/18 0340 116/65 97.6 F (36.4 C) Oral 69 17 5\' 7"  (1.702 m) 117.5 kg   Constitutional: Well-developed, well-nourished female in no acute distress.  Cardiovascular: normal rate & rhythm, no murmur Respiratory: normal rate and effort. Lung sounds clear throughout GI: Abd soft, non-tender, Pos BS x 4. No guarding or rebound tenderness MS: Anterior thigh TTP. no edema, normal ROM Neurologic: Alert and oriented x 4.  GU:      BIMANUAL: No CMT. cervix closed; uterus normal size, no adnexal tenderness or masses.    LAB RESULTS Results for orders placed or performed during the hospital encounter of 03/21/18 (from the past 24 hour(s))  Urinalysis, Routine w reflex microscopic     Status: Abnormal   Collection Time: 03/21/18  4:01 AM  Result Value Ref Range   Color, Urine YELLOW YELLOW   APPearance HAZY (A) CLEAR   Specific Gravity, Urine 1.015 1.005 - 1.030   pH 5.0 5.0 - 8.0   Glucose, UA NEGATIVE NEGATIVE mg/dL   Hgb urine dipstick NEGATIVE NEGATIVE   Bilirubin Urine NEGATIVE NEGATIVE   Ketones, ur NEGATIVE NEGATIVE mg/dL   Protein, ur NEGATIVE NEGATIVE mg/dL   Nitrite NEGATIVE NEGATIVE   Leukocytes, UA MODERATE (A) NEGATIVE   RBC / HPF 0-5 0 - 5 RBC/hpf   WBC, UA 21-50 0 - 5 WBC/hpf   Bacteria, UA MANY (A) NONE SEEN   Squamous Epithelial / LPF 0-5 0 - 5   Mucus PRESENT   Pregnancy, urine POC     Status: Abnormal   Collection Time: 03/21/18  4:03 AM  Result Value Ref Range   Preg Test, Ur POSITIVE (A) NEGATIVE  Wet prep, genital     Status: Abnormal   Collection Time: 03/21/18  5:17 AM  Result Value Ref Range   Yeast Wet Prep HPF POC NONE SEEN NONE SEEN   Trich, Wet Prep NONE SEEN NONE SEEN   Clue Cells Wet Prep HPF POC NONE SEEN NONE SEEN   WBC, Wet Prep HPF POC  MODERATE (A) NONE SEEN   Sperm NONE SEEN     IMAGING US Ob Comp Less 14 Wks  Result Date: 03/21/2018 CLINICAL DATA:  Left hip pain. Positive urine pregnancy test. Estimated gestational age by LMP is 9 weeks 6 days. EXAM: OBSTETRIC <14 WK ULTRASOUND TECHNIQUE: Transabdominal ultrasound was performed for evaluation of the gestation as well as the maternal uterus and adnexal regions. COMPARISON:  None. FINDINGS: Intrauterine gestational sac: A single intrauterine gestational sac is identified. Yolk sac:  The yolk sac is not visualized. Embryo:  Fetal pole is present. Cardiac Activity: Fetal cardiac activity is observed. Heart Rate: 169 bpm CRL: 38.9 mm   10 w 5 d                  [place] EDC: 10/12/2018 Subchorionic hemorrhage:  None visualized. Maternal uterus/adnexae: Uterus is anteverted. No myometrial mass lesions identified. Both ovaries are visualized and appear normal. No abnormal pelvic fluid collections. IMPRESSION: Single intrauterine pregnancy. Estimated gestational age by crown-rump length is 10 weeks 5 days. No acute complication is suggested sonographically. Electronically Signed   By: Burman Nieves M.D.   On: 03/21/2018 05:12    MAU COURSE Orders Placed This Encounter  Procedures  . Wet prep, genital  . Culture, OB Urine  . US OB Comp Less 14 Wks  . Urinalysis, Routine w reflex microscopic  . Pregnancy, urine POC  . Discharge patient   Meds ordered this encounter  Medications  . acetaminophen (TYLENOL) tablet 1,000 mg    MDM Ultrasound shows normal IUP with cardiac activity. No adnexal masses.  Tylenol 1 gm PO given with improvement in pain GC/CT & wet prep collected  ASSESSMENT 1. Abdominal pain during pregnancy in first trimester   2. Pain of left hip joint   3. [redacted] weeks gestation of pregnancy     PLAN Discharge home in stable condition. Take tylenol prn pain Discussed reasons to return to MAU List of ob/gyn providers given  Allergies as of 03/21/2018   No  Known Allergies     Medication List    STOP taking these medications   fluconazole 150 MG tablet Commonly known as:  DIFLUCAN   metroNIDAZOLE 500 MG tablet Commonly known as:  FLAGYL   NONFORMULARY OR COMPOUNDED ITEM   tinidazole 500 MG tablet Commonly known as:  TINDAMAX     TAKE these medications   prenatal multivitamin Tabs tablet Take 1 tablet by mouth daily at 12 noon.        Judeth Horn, NP 03/21/2018  9:52 AM

## 2018-03-21 NOTE — MAU Note (Signed)
Pt presents to MAU c/o left sided pain that she shows me to be on her left hip and radiates down her left leg. Pt states the pain is a sharp and shooting pain. Pt denies abdominal pain, vaginal bleeding and discharge. No other complaints.

## 2018-03-21 NOTE — Discharge Instructions (Signed)
[facility] Prenatal Care Providers   Center for [facility] Healthcare at [facility]       Phone: [phone]  Center for [facility] Healthcare at [place] Phone: [phone]  Center for [facility] Healthcare at [place]  Phone: [phone]  Center for [facility] Healthcare at [facility]  Phone: [phone]  Center for [facility] Healthcare at [place]  Phone: [phone]  [facility]       Phone: [phone]  [facility]    Phone: [phone]   [facility] [place])    Phone: [phone]  [facility]    Phone: [phone]  [facility]    Phone: [phone]   [facility]  Phone: [phone]  [facility]    Phone: [phone]  [facility]   Phone: [phone]  [facility]    Phone: [phone]        Abdominal Pain During Pregnancy Belly (abdominal) pain is common during pregnancy. Most of the time, it is not a serious problem. Other times, it can be a sign that something is wrong with the pregnancy. Always tell your doctor if you have belly pain. Follow these instructions at home: Monitor your belly pain for any changes. The following actions may help you feel better:  Do not have sex (intercourse) or put anything in your vagina until you feel better.  Rest until your pain stops.  Drink clear fluids if you feel sick to your stomach (nauseous). Do not eat solid food until you feel better.  Only take medicine as told by your doctor.  Keep all doctor visits as told.  Get help right away if:  You are bleeding, leaking fluid, or pieces of tissue come out of your vagina.  You have more pain or cramping.  You keep throwing up (vomiting).  You have pain when you pee (urinate) or have blood in your pee.  You have a fever.  You do not feel your baby moving  as much.  You feel very weak or feel like passing out.  You have trouble breathing, with or without belly pain.  You have a very bad headache and belly pain.  You have fluid leaking from your vagina and belly pain.  You keep having watery poop (diarrhea).  Your belly pain does not go away after resting, or the pain gets worse. This information is not intended to replace advice given to you by your health care provider. Make sure you discuss any questions you have with your health care provider. Document Released: 05/12/2009 Document Revised: 12/31/2015 Document Reviewed: 12/21/2012 Elsevier Interactive Patient Education  [employer].

## 2018-03-22 LAB — GC/CHLAMYDIA PROBE AMP (~~LOC~~) NOT AT ARMC
CHLAMYDIA, DNA PROBE: NEGATIVE
Neisseria Gonorrhea: NEGATIVE

## 2018-03-23 LAB — CULTURE, OB URINE

## 2018-03-24 ENCOUNTER — Other Ambulatory Visit: Payer: Self-pay | Admitting: Obstetrics and Gynecology

## 2018-03-24 DIAGNOSIS — N390 Urinary tract infection, site not specified: Secondary | ICD-10-CM

## 2018-03-24 MED ORDER — CEPHALEXIN 500 MG PO CAPS: 500.0000 mg | ORAL_CAPSULE | Freq: Four times a day (QID) | ORAL | 0 refills | Status: DC

## 2018-03-24 NOTE — Progress Notes (Unsigned)
TC to patient no answer - LVM to call MAU provider office  Rx sent to pharmacy on file  Raelyn Mora, CNM

## 2018-03-31 ENCOUNTER — Inpatient Hospital Stay (HOSPITAL_COMMUNITY)
Admission: AD | Admit: 2018-03-31 | Discharge: 2018-03-31 | Disposition: A | Discharge status: Home / Self Care | Payer: BLUE CROSS/BLUE SHIELD | Source: Ambulatory Visit | Attending: Obstetrics & Gynecology | Admitting: Obstetrics & Gynecology

## 2018-03-31 DIAGNOSIS — B3731 Acute candidiasis of vulva and vagina: Secondary | ICD-10-CM

## 2018-03-31 DIAGNOSIS — O99341 Other mental disorders complicating pregnancy, first trimester: Secondary | ICD-10-CM | POA: Insufficient documentation

## 2018-03-31 DIAGNOSIS — B373 Candidiasis of vulva and vagina: Secondary | ICD-10-CM | POA: Insufficient documentation

## 2018-03-31 DIAGNOSIS — F419 Anxiety disorder, unspecified: Secondary | ICD-10-CM | POA: Diagnosis not present

## 2018-03-31 DIAGNOSIS — Z3A11 11 weeks gestation of pregnancy: Secondary | ICD-10-CM | POA: Diagnosis not present

## 2018-03-31 DIAGNOSIS — N898 Other specified noninflammatory disorders of vagina: Secondary | ICD-10-CM | POA: Diagnosis present

## 2018-03-31 DIAGNOSIS — Z87891 Personal history of nicotine dependence: Secondary | ICD-10-CM | POA: Insufficient documentation

## 2018-03-31 DIAGNOSIS — O98811 Other maternal infectious and parasitic diseases complicating pregnancy, first trimester: Secondary | ICD-10-CM | POA: Diagnosis not present

## 2018-03-31 DIAGNOSIS — O26891 Other specified pregnancy related conditions, first trimester: Secondary | ICD-10-CM | POA: Diagnosis not present

## 2018-03-31 LAB — URINALYSIS, ROUTINE W REFLEX MICROSCOPIC
BILIRUBIN URINE: NEGATIVE
Glucose, UA: NEGATIVE mg/dL
Ketones, ur: NEGATIVE mg/dL
Nitrite: NEGATIVE
PH: 7 (ref 5.0–8.0)
Protein, ur: NEGATIVE mg/dL
SPECIFIC GRAVITY, URINE: 1.016 (ref 1.005–1.030)

## 2018-03-31 LAB — WET PREP, GENITAL
CLUE CELLS WET PREP: NONE SEEN
Sperm: NONE SEEN
Trich, Wet Prep: NONE SEEN
YEAST WET PREP: NONE SEEN

## 2018-03-31 LAB — OB RESULTS CONSOLE GBS: GBS: POSITIVE

## 2018-03-31 MED ORDER — TERCONAZOLE 0.4 % VA CREA: 1.0000 | TOPICAL_CREAM | Freq: Every day | VAGINAL | 0 refills | Status: DC

## 2018-03-31 NOTE — Discharge Instructions (Signed)

## 2018-03-31 NOTE — Progress Notes (Signed)
Written and verbal d/c instructions given and understanding voiced. 

## 2018-03-31 NOTE — MAU Provider Note (Signed)
Chief Complaint: Vaginal Discharge   First Provider Initiated Contact with Patient 03/31/18 2337       SUBJECTIVE HPI: Courtney Woodward is a 37 y.o. [id number] at [redacted]w[redacted]d who presents to Maternity Admissions reporting vaginal discharge and itching. Completed Keflex Rx for UTI 2 days ago. No urinary complaints. Today.   Modifying factors: Hasn't tried anything for Sx.  Associated signs and symptoms: Neg for fever, chills, dysuria, urgency, frequency, abd pain, VB.    Past Medical History:  Diagnosis Date  . 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 Anxiety   . Hx of trichomoniasis 12/2017  . PIH (pregnancy induced hypertension) 2004   OB History  Gravida Para Term Preterm AB Living  4 2 2  0 1 2  SAB TAB Ectopic Multiple Live Births  0 1 0 1 2    # Outcome Date GA Lbr Len/2nd Weight Sex Delivery Anes PTL Lv  4 Current           3A Term 2005 [redacted]w[redacted]d   F Vag-Spont   LIV  3B Term 2005 [redacted]w[redacted]d   F Vag-Spont   FD     Complications: Fetal demise, less than 22 weeks  2 Term 2004 110w0d   M Vag-Spont   LIV  1 TAB            Past Surgical History:  Procedure Laterality Date  . LEEP  11/2017  . OOPHORECTOMY  2004   Rt. ovary removed--Hemorrhagic Corpus Luteum Cyst   Social History   Socioeconomic History  . Marital status: Married    Spouse name: Not on file  . Number of children: Not on file  . Years of education: Not on file  . Highest education level: Not on file  Occupational History  . Not on file  Social Needs  . Financial resource strain: Not on file  . Food insecurity:    Worry: Not on file    Inability: Not on file  . Transportation needs:    Medical: Not on file    Non-medical: Not on file  Tobacco Use  . Smoking status: Former Smoker    Last attempt to quit: 06/07/2001    Years since quitting: 16.8  Substance and Sexual Activity  . Alcohol use: Not Currently    Alcohol/week: 0.0 standard drinks    Comment: only occ.  . Drug use: No  . Sexual activity: Yes    Partners: Male    Birth control/protection: Condom    Comment: Condoms sometimes  Lifestyle  . Physical activity:    Days per week: Not on file    Minutes per session: Not on file  . Stress: Not on file  Relationships  . Social connections:    Talks on phone: Not on file    Gets together: Not on file    Attends religious service: Not on file    Active member of club or organization: Not on file    Attends meetings of clubs or organizations: Not on file    Relationship status: Not on file  . Intimate partner violence:    Fear of current or ex partner: Not on file    Emotionally abused: Not on file    Physically abused: Not on file    Forced sexual activity: Not on file  Other Topics Concern  . Not on file  Social History Narrative  . Not on file   Family History  Problem Relation Age of Onset  .  Stroke Mother        Hx of 3 strokes  . Cancer Mother        Dec unknown Cancer age 39  . Diabetes Mother   . Hyperlipidemia Mother    No current facility-administered medications on file prior to encounter.    Current Outpatient Medications on File Prior to Encounter  Medication Sig Dispense Refill  . Prenatal Vit-Fe Fumarate-FA (PRENATAL MULTIVITAMIN) TABS tablet Take 1 tablet by mouth daily at 12 noon.     No Known Allergies  I have reviewed patient's Past Medical Hx, Surgical Hx, Family Hx, Social Hx, medications and allergies.   Review of Systems  OBJECTIVE Patient Vitals for the past 24 hrs:  BP Temp Pulse Resp Height Weight  03/31/18 2024 110/66 - 68 - - -  03/31/18 2021 - 98.5 F (36.9 C) - 18 5\' 7"  (1.702 m) 118.4 kg   Constitutional: Well-developed, well-nourished female in no acute distress.  Cardiovascular: normal rate Respiratory: normal rate and effort.  GI: Abd soft, non-tender, gravid appropriate for gestational age. Pos BS x 4 Neurologic: Alert and oriented x 4.  GU: Deferred  LAB RESULTS Results for orders placed or performed during the hospital  encounter of 03/31/18 (from the past 24 hour(s))  Urinalysis, Routine w reflex microscopic     Status: Abnormal   Collection Time: 03/31/18  6:50 PM  Result Value Ref Range   Color, Urine YELLOW YELLOW   APPearance HAZY (A) CLEAR   Specific Gravity, Urine 1.016 1.005 - 1.030   pH 7.0 5.0 - 8.0   Glucose, UA NEGATIVE NEGATIVE mg/dL   Hgb urine dipstick SMALL (A) NEGATIVE   Bilirubin Urine NEGATIVE NEGATIVE   Ketones, ur NEGATIVE NEGATIVE mg/dL   Protein, ur NEGATIVE NEGATIVE mg/dL   Nitrite NEGATIVE NEGATIVE   Leukocytes, UA LARGE (A) NEGATIVE   RBC / HPF 0-5 0 - 5 RBC/hpf   WBC, UA 6-10 0 - 5 WBC/hpf   Bacteria, UA RARE (A) NONE SEEN   Squamous Epithelial / LPF 6-10 0 - 5   Mucus PRESENT   Wet prep, genital     Status: Abnormal   Collection Time: 03/31/18  9:33 PM  Result Value Ref Range   Yeast Wet Prep HPF POC NONE SEEN NONE SEEN   Trich, Wet Prep NONE SEEN NONE SEEN   Clue Cells Wet Prep HPF POC NONE SEEN NONE SEEN   WBC, Wet Prep HPF POC FEW (A) NONE SEEN   Sperm NONE SEEN     IMAGING NA  MAU COURSE Orders Placed This Encounter  Procedures  . Wet prep, genital  . Culture, OB Urine  . Urinalysis, Routine w reflex microscopic  . Lab instructions  . Discharge patient   Meds ordered this encounter  Medications  . terconazole (TERAZOL 7) 0.4 % vaginal cream    Sig: Place 1 applicator vaginally at bedtime.    Dispense:  45 g    Refill:  0    Order Specific Question:   Supervising Provider    Answer:   Duane Lope H [[id number]]    MDM - Vaginal discharge w/ itching post-ABX C/W VVC. Rx Terazol.   ASSESSMENT 1. Vaginal yeast infection     PLAN Discharge home in stable condition. First trimester precautions Rx Terazol GC/Chlamydia pending Follow-up Information    [facility] Follow up on 04/17/2018.   Why:  for new OB appointment  Contact information: [address] [place]  [address] [phone]       [facility] Follow up.   Why:  as needed in pregnancy emergencies Contact information: [address] [id number] mc [place] [address] [phone]         Allergies as of 03/31/2018   No Known Allergies     Medication List    STOP taking these medications   cephALEXin 500 MG capsule Commonly known as:  KEFLEX     TAKE these medications   prenatal multivitamin Tabs tablet Take 1 tablet by mouth daily at 12 noon.   terconazole 0.4 % vaginal cream Commonly known as:  TERAZOL 7 Place 1 applicator vaginally at bedtime.        Katrinka Blazing, [place], CNM 03/31/2018  11:43 PM

## 2018-03-31 NOTE — MAU Note (Addendum)
Having vag d/c for 2 days. Shaved and afterward started having vag d/c, itching, and burning.Has not shaved perineum in along time. D/C is white and no odor. Denies dysuria. Has not started care yet but has appt with [facility] 04/17/18

## 2018-03-31 NOTE — MAU Note (Signed)
Pt did vag self swabs and then back to lobby. Understands when wet prep is back will return to Triage and provider will see her. Agrees with POC.

## 2018-03-31 NOTE — Progress Notes (Signed)
[name] CNM discussed test results and d/c plan with pt. WRitten and verbal d/c instructions given and understanding voiced

## 2018-03-31 NOTE — MAU Note (Signed)
[name] CNM in Triage to discuss test results and POC with pt

## 2018-04-03 ENCOUNTER — Encounter: Payer: Self-pay | Admitting: Advanced Practice Midwife

## 2018-04-03 ENCOUNTER — Telehealth: Payer: Self-pay | Admitting: *Deleted

## 2018-04-03 ENCOUNTER — Other Ambulatory Visit: Payer: Self-pay | Admitting: Advanced Practice Midwife

## 2018-04-03 DIAGNOSIS — R8271 Bacteriuria: Secondary | ICD-10-CM

## 2018-04-03 LAB — CULTURE, OB URINE

## 2018-04-03 MED ORDER — PENICILLIN V POTASSIUM 500 MG PO TABS: 500.0000 mg | ORAL_TABLET | Freq: Three times a day (TID) | ORAL | 0 refills | Status: AC

## 2018-04-03 NOTE — Telephone Encounter (Signed)
Called patient, no answer- left message stating your most recent urine culture shows you have a urinary tract infection & a prescription has been sent to your pharmacy for treatment. Please pick up your prescription & take as directed. You may call us back if you have questions/concerns.

## 2018-04-03 NOTE — Telephone Encounter (Signed)
-----   Message from [place], [place] sent at 04/03/2018  9:31 AM EDT ----- Please notify pt that her urine culture is still positive for E.Coli and now positive for GBS. Rx PCN. Will need PCN in labor for GBS prophylaxis also.

## 2018-04-03 NOTE — Progress Notes (Signed)
Urine culture still pos for E.Coli and now pos GBS. Rx sent for PCN.

## 2018-04-03 NOTE — Telephone Encounter (Signed)
Called pt to inform her of her urine culture results.  Pt did not pick up and message left instructing the pt to call the office to discuss results.

## 2018-04-04 LAB — GC/CHLAMYDIA PROBE AMP (~~LOC~~) NOT AT ARMC
CHLAMYDIA, DNA PROBE: NEGATIVE
NEISSERIA GONORRHEA: NEGATIVE

## 2018-04-13 ENCOUNTER — Encounter: Payer: BLUE CROSS/BLUE SHIELD | Admitting: Obstetrics and Gynecology

## 2018-04-17 ENCOUNTER — Encounter: Payer: BLUE CROSS/BLUE SHIELD | Admitting: Obstetrics and Gynecology

## 2018-05-03 ENCOUNTER — Encounter: Payer: Self-pay | Admitting: Advanced Practice Midwife

## 2018-05-03 ENCOUNTER — Other Ambulatory Visit (HOSPITAL_COMMUNITY)
Admission: RE | Admit: 2018-05-03 | Discharge: 2018-05-03 | Disposition: A | Discharge status: Home / Self Care | Payer: BLUE CROSS/BLUE SHIELD | Source: Ambulatory Visit | Attending: Obstetrics and Gynecology | Admitting: Obstetrics and Gynecology

## 2018-05-03 ENCOUNTER — Ambulatory Visit (INDEPENDENT_AMBULATORY_CARE_PROVIDER_SITE_OTHER): Payer: BLUE CROSS/BLUE SHIELD | Admitting: Advanced Practice Midwife

## 2018-05-03 VITALS — BP 117/74 | HR 80 | Wt 267.0 lb

## 2018-05-03 DIAGNOSIS — Z113 Encounter for screening for infections with a predominantly sexual mode of transmission: Secondary | ICD-10-CM

## 2018-05-03 DIAGNOSIS — Z3480 Encounter for supervision of other normal pregnancy, unspecified trimester: Secondary | ICD-10-CM | POA: Insufficient documentation

## 2018-05-03 DIAGNOSIS — O09291 Supervision of pregnancy with other poor reproductive or obstetric history, first trimester: Secondary | ICD-10-CM | POA: Insufficient documentation

## 2018-05-03 DIAGNOSIS — Z3482 Encounter for supervision of other normal pregnancy, second trimester: Secondary | ICD-10-CM

## 2018-05-03 DIAGNOSIS — R8271 Bacteriuria: Secondary | ICD-10-CM

## 2018-05-03 DIAGNOSIS — O09292 Supervision of pregnancy with other poor reproductive or obstetric history, second trimester: Secondary | ICD-10-CM

## 2018-05-03 NOTE — Addendum Note (Signed)
Addended by: Natale MilchSTALLING, Adanna Zuckerman D on: 05/03/2018 04:21 PM   Modules accepted: Orders

## 2018-05-03 NOTE — Patient Instructions (Signed)

## 2018-05-03 NOTE — Progress Notes (Signed)
Patient is in the office for initial ob visit. Pt denies pain, states that [place]OB [place], so her family is her main support system.

## 2018-05-03 NOTE — Progress Notes (Signed)
   PRENATAL VISIT NOTE  Subjective:  Courtney Woodward is a 37 y.o. [id number] at 1141w0d being seen today for initial prenatal visit.  She is currently monitored for the following issues for this low-risk pregnancy and has GBS bacteriuria; Supervision of other normal pregnancy, antepartum; and Pregnancy with history of spontaneous abortion in first trimester on their problem list.  Patient reports no complaints.  Contractions: Not present. Vag. Bleeding: None.  Movement: Present. Denies leaking of fluid.   The following portions of the patient's history were reviewed and updated as appropriate: allergies, current medications, past family history, past medical history, past social history, past surgical history and problem list. Problem list updated.  Objective:   Vitals:   05/03/18 1451  BP: 117/74  Pulse: 80  Weight: 121.1 kg    Fetal Status: Fetal Heart Rate (bpm): 145   Movement: Present     VS reviewed, nursing note reviewed,  Constitutional: well developed, well nourished, no distress HEENT: normocephalic CV: normal rate Pulm/chest wall: normal effort Breast Exam:  right breast normal without mass, skin or nipple changes or axillary nodes, left breast normal without mass, skin or nipple changes or axillary nodes Abdomen: soft Neuro: alert and oriented x 3 Skin: warm, dry Psych: affect normal Pelvic exam: GCC collected by blind swab. Pap results from 2018 requested.  Assessment and Plan:  Pregnancy: [id number] at 6641w0d  1. Supervision of other normal pregnancy, antepartum --Anticipatory guidance about next visits/weeks of pregnancy given. --Discussed and offered genetic screening options, including Quad screen/AFP, NIPS testing, and option to decline testing. Benefits/risks/alternatives reviewed. Pt aware that anatomy US is form of genetic screening with lower accuracy in detecting trisomies than blood work.  Pt chooses NIPS and AFP, CF, and SNM genetic screening today. - Pap results  from 2018 requested. - Obstetric Panel, Including HIV - Hemoglobinopathy evaluation - Cystic Fibrosis Mutation 97 - SMN1 Copy Number Analysis - Genetic Screening - Culture, OB Urine  2. GBS bacteriuria --UTI treated with PCN. Will need prophylaxis in labor.   3. Screen for STD (sexually transmitted disease) --Pt desires full panel so Hep C added - Hepatitis C antibody  4. Pregnancy with history of spontaneous abortion in first trimester --Pt with previous twins pregnancy. One twin with SAB in first trimester, second twin delivered without complication at 37 weeks.   Preterm labor symptoms and general obstetric precautions including but not limited to vaginal bleeding, contractions, leaking of fluid and fetal movement were reviewed in detail with the patient. Please refer to After Visit Summary for other counseling recommendations.  Return in about 4 weeks (around 05/31/2018).  No future appointments.  Sharen CounterLisa Leftwich-Kirby, CNM

## 2018-05-04 LAB — OBSTETRIC PANEL, INCLUDING HIV
Antibody Screen: NEGATIVE
BASOS: 0 %
Basophils Absolute: 0 10*3/uL (ref 0.0–0.2)
EOS (ABSOLUTE): 0.2 10*3/uL (ref 0.0–0.4)
EOS: 2 %
HEMATOCRIT: 37.9 % (ref 34.0–46.6)
HEMOGLOBIN: 12.8 g/dL (ref 11.1–15.9)
HIV Screen 4th Generation wRfx: NONREACTIVE
Hepatitis B Surface Ag: NEGATIVE
IMMATURE GRANS (ABS): 0.1 10*3/uL (ref 0.0–0.1)
IMMATURE GRANULOCYTES: 1 %
LYMPHS ABS: 2.5 10*3/uL (ref 0.7–3.1)
LYMPHS: 22 %
MCH: 30.4 pg (ref 26.6–33.0)
MCHC: 33.8 g/dL (ref 31.5–35.7)
MCV: 90 fL (ref 79–97)
MONOS ABS: 0.6 10*3/uL (ref 0.1–0.9)
Monocytes: 5 %
NEUTROS PCT: 70 %
Neutrophils Absolute: 7.9 10*3/uL — ABNORMAL HIGH (ref 1.4–7.0)
Platelets: 281 10*3/uL (ref 150–450)
RBC: 4.21 x10E6/uL (ref 3.77–5.28)
RDW: 12.8 % (ref 12.3–15.4)
RH TYPE: POSITIVE
RPR Ser Ql: NONREACTIVE
Rubella Antibodies, IGG: 2.16 index (ref >0.99)
WBC: 11.3 10*3/uL — ABNORMAL HIGH (ref 3.4–10.8)

## 2018-05-04 LAB — HEMOGLOBINOPATHY EVALUATION
HGB C: 0 %
HGB S: 0 %
HGB VARIANT: 0 %
Hemoglobin A2 Quantitation: 2.4 % (ref 1.8–3.2)
Hemoglobin F Quantitation: 0 % (ref 0.0–2.0)
Hgb A: 97.6 % (ref 96.4–98.8)

## 2018-05-04 LAB — HEPATITIS C ANTIBODY

## 2018-05-05 LAB — CERVICOVAGINAL ANCILLARY ONLY
CHLAMYDIA, DNA PROBE: NEGATIVE
Neisseria Gonorrhea: NEGATIVE

## 2018-05-07 LAB — AFP, SERUM, OPEN SPINA BIFIDA
AFP MoM: 0.93
AFP Value: 21.9 ng/mL
GEST. AGE ON COLLECTION DATE: 16 wk
Maternal Age At EDD: 39.3 yr
OSBR RISK 1 IN: 10000
TEST RESULTS AFP: NEGATIVE
WEIGHT: 267 [lb_av]

## 2018-05-07 LAB — CULTURE, OB URINE

## 2018-05-07 LAB — URINE CULTURE, OB REFLEX

## 2018-05-09 ENCOUNTER — Other Ambulatory Visit: Payer: Self-pay | Admitting: Advanced Practice Midwife

## 2018-05-09 MED ORDER — CEPHALEXIN 500 MG PO CAPS: 500.0000 mg | ORAL_CAPSULE | Freq: Four times a day (QID) | ORAL | 0 refills | Status: DC

## 2018-05-10 ENCOUNTER — Telehealth: Payer: Self-pay

## 2018-05-11 LAB — CYSTIC FIBROSIS MUTATION 97: GENE DIS ANAL CARRIER INTERP BLD/T-IMP: NOT DETECTED

## 2018-05-13 LAB — SMN1 COPY NUMBER ANALYSIS (SMA CARRIER SCREENING)

## 2018-05-15 ENCOUNTER — Encounter: Payer: Self-pay | Admitting: Advanced Practice Midwife

## 2018-05-29 ENCOUNTER — Encounter: Payer: BLUE CROSS/BLUE SHIELD | Admitting: Advanced Practice Midwife

## 2018-06-07 NOTE — L&D Delivery Note (Addendum)
OB/GYN Faculty Practice Delivery Note  Courtney Woodward is a 38 y.o. [phone] s/p SVD at [redacted]w[redacted]d. She was admitted for PPROM @[redacted]w[redacted]d .   ROM: 179h 57m with clear fluid GBS Status: urine + - on penicillin Maximum Maternal Temperature: 98.2  Labor Progress: . Augmented with cytotec and FB  Delivery Date/Time: 1601 09/01/2018 Delivery: Called to room and patient was complete and pushing. Head delivered ROA. Nuchal cord x2 present, loose and reduced at perineum. Shoulder and body delivered in usual fashion. Infant with spontaneous cry, placed on mother's abdomen, dried and stimulated. Cord clamped x 2 after 5-minute delay, and cut by FOB. Cord blood drawn. Placenta delivered spontaneously with gentle cord traction. Fundus firm with massage and Pitocin. Labia, perineum, vagina, and cervix inspected inspected with 2nd degree perineal laceration, repaired with 3.0 vicryl rapide in the usual fashion, hemostatic afterwards. Also noted two lesions on superior L labia, concern for ruptured vesicles. Patient thinks she cut her skin shaving and denies any history of HSV lesions. Collected viral swab of the lesions, will follow up on results.   Placenta: delivered spontaneously, intact Complications: prolonged ROM Lacerations: 2nd degree perineal, repaired with 3.0 vicryl rapide EBL:  Infant: vigorous female  APGARs 8 & 9  weight pending  Courtney Nieves, MD Family Medicine Resident    Midwife attestation: I was gloved and present for delivery in its entirety and I agree with the above resident's note.  Donette Larry, CNM 5:08 PM

## 2018-06-14 ENCOUNTER — Encounter: Payer: Self-pay | Admitting: Obstetrics & Gynecology

## 2018-06-14 ENCOUNTER — Ambulatory Visit (INDEPENDENT_AMBULATORY_CARE_PROVIDER_SITE_OTHER): Payer: Medicaid Other | Admitting: Obstetrics & Gynecology

## 2018-06-14 VITALS — BP 110/69 | HR 78 | Wt 275.3 lb

## 2018-06-14 DIAGNOSIS — R8271 Bacteriuria: Secondary | ICD-10-CM

## 2018-06-14 DIAGNOSIS — O09512 Supervision of elderly primigravida, second trimester: Secondary | ICD-10-CM

## 2018-06-14 DIAGNOSIS — O09529 Supervision of elderly multigravida, unspecified trimester: Secondary | ICD-10-CM

## 2018-06-14 DIAGNOSIS — O09519 Supervision of elderly primigravida, unspecified trimester: Secondary | ICD-10-CM

## 2018-06-14 DIAGNOSIS — O09522 Supervision of elderly multigravida, second trimester: Secondary | ICD-10-CM

## 2018-06-14 DIAGNOSIS — Z3482 Encounter for supervision of other normal pregnancy, second trimester: Secondary | ICD-10-CM

## 2018-06-14 DIAGNOSIS — Z3480 Encounter for supervision of other normal pregnancy, unspecified trimester: Secondary | ICD-10-CM

## 2018-06-14 DIAGNOSIS — Z3A22 22 weeks gestation of pregnancy: Secondary | ICD-10-CM

## 2018-06-14 NOTE — Progress Notes (Signed)
Patient reports fetal movement, denies pain. Pt advised that she does not want BTL after delivery, she is thinking about BC pills.

## 2018-06-14 NOTE — Progress Notes (Signed)
   PRENATAL VISIT NOTE  Subjective:  Courtney Woodward is a 38 y.o. [id number] at [redacted]w[redacted]d being seen today for ongoing prenatal care.  She is currently monitored for the following issues for this low-risk pregnancy and has GBS bacteriuria; Supervision of other normal pregnancy, antepartum; and Pregnancy with history of spontaneous abortion in first trimester on their problem list.  Patient reports no complaints.  Contractions: Not present. Vag. Bleeding: None.  Movement: Present. Denies leaking of fluid.   The following portions of the patient's history were reviewed and updated as appropriate: allergies, current medications, past family history, past medical history, past social history, past surgical history and problem list. Problem list updated.  Objective:   Vitals:   06/14/18 1015  BP: 110/69  Pulse: 78  Weight: 275 lb 4.8 oz (124.9 kg)    Fetal Status: Fetal Heart Rate (bpm): 150   Movement: Present     General:  Alert, oriented and cooperative. Patient is in no acute distress.  Skin: Skin is warm and dry. No rash noted.   Cardiovascular: Normal heart rate noted  Respiratory: Normal respiratory effort, no problems with respiration noted  Abdomen: Soft, gravid, appropriate for gestational age.  Pain/Pressure: Absent     Pelvic: Cervical exam deferred        Extremities: Normal range of motion.  Edema: Trace  Mental Status: Normal mood and affect. Normal behavior. Normal judgment and thought content.   Assessment and Plan:  Pregnancy: [id number] at [redacted]w[redacted]d  1. Supervision of other normal pregnancy, antepartum FH 28 weeks Missed [place] appt due to holiday travel . Did not reschedule. Reordered.    2. GBS bacteriuria Needs atvx in labor  3. AMA  Preterm labor symptoms and general obstetric precautions including but not limited to vaginal bleeding, contractions, leaking of fluid and fetal movement were reviewed in detail with the patient. Please refer to After Visit Summary for other  counseling recommendations.  Return in about 4 weeks (around 07/12/2018).  Future Appointments  Date Time Provider Department Center  06/14/2018 10:45 AM Willodean Rosenthal, MD [facility] None    Willodean Rosenthal, MD

## 2018-06-15 ENCOUNTER — Encounter (HOSPITAL_COMMUNITY): Payer: Self-pay

## 2018-06-22 ENCOUNTER — Encounter (HOSPITAL_COMMUNITY): Payer: Self-pay

## 2018-06-22 ENCOUNTER — Ambulatory Visit (HOSPITAL_COMMUNITY)
Admission: RE | Admit: 2018-06-22 | Discharge: 2018-06-22 | Disposition: A | Discharge status: Home / Self Care | Payer: Medicaid Other | Source: Ambulatory Visit | Attending: Obstetrics | Admitting: Obstetrics

## 2018-06-22 ENCOUNTER — Other Ambulatory Visit (HOSPITAL_COMMUNITY): Payer: Self-pay | Admitting: *Deleted

## 2018-06-22 DIAGNOSIS — Z363 Encounter for antenatal screening for malformations: Secondary | ICD-10-CM | POA: Diagnosis not present

## 2018-06-22 DIAGNOSIS — Z3480 Encounter for supervision of other normal pregnancy, unspecified trimester: Secondary | ICD-10-CM | POA: Diagnosis present

## 2018-06-22 DIAGNOSIS — O09519 Supervision of elderly primigravida, unspecified trimester: Secondary | ICD-10-CM | POA: Insufficient documentation

## 2018-06-22 DIAGNOSIS — O09522 Supervision of elderly multigravida, second trimester: Secondary | ICD-10-CM

## 2018-06-22 DIAGNOSIS — O99212 Obesity complicating pregnancy, second trimester: Secondary | ICD-10-CM

## 2018-06-22 DIAGNOSIS — Z3A23 23 weeks gestation of pregnancy: Secondary | ICD-10-CM

## 2018-06-22 DIAGNOSIS — O09292 Supervision of pregnancy with other poor reproductive or obstetric history, second trimester: Secondary | ICD-10-CM

## 2018-06-22 DIAGNOSIS — O99210 Obesity complicating pregnancy, unspecified trimester: Secondary | ICD-10-CM | POA: Insufficient documentation

## 2018-07-12 ENCOUNTER — Encounter: Payer: Self-pay | Admitting: Obstetrics and Gynecology

## 2018-07-12 ENCOUNTER — Ambulatory Visit (INDEPENDENT_AMBULATORY_CARE_PROVIDER_SITE_OTHER): Payer: Medicaid Other | Admitting: Obstetrics and Gynecology

## 2018-07-12 VITALS — BP 112/71 | HR 74 | Wt 279.0 lb

## 2018-07-12 DIAGNOSIS — O09522 Supervision of elderly multigravida, second trimester: Secondary | ICD-10-CM

## 2018-07-12 DIAGNOSIS — Z3480 Encounter for supervision of other normal pregnancy, unspecified trimester: Secondary | ICD-10-CM

## 2018-07-12 DIAGNOSIS — Z3482 Encounter for supervision of other normal pregnancy, second trimester: Secondary | ICD-10-CM

## 2018-07-12 DIAGNOSIS — R8271 Bacteriuria: Secondary | ICD-10-CM

## 2018-07-12 NOTE — Progress Notes (Signed)
Subjective:  Courtney Woodward is a 38 y.o. [id number] at [redacted]w[redacted]d being seen today for ongoing prenatal care.  She is currently monitored for the following issues for this high-risk pregnancy and has GBS bacteriuria; Supervision of other normal pregnancy, antepartum; Pregnancy with history of spontaneous abortion in first trimester; Advanced maternal age in multigravida; and Obesity affecting pregnancy, antepartum on their problem list.  Patient reports URI Sx.  Contractions: Not present. Vag. Bleeding: None.  Movement: Present. Denies leaking of fluid.   The following portions of the patient's history were reviewed and updated as appropriate: allergies, current medications, past family history, past medical history, past social history, past surgical history and problem list. Problem list updated.  Objective:   Vitals:   07/12/18 1014  BP: 112/71  Pulse: 74  Weight: 279 lb (126.6 kg)    Fetal Status:     Movement: Present     General:  Alert, oriented and cooperative. Patient is in no acute distress.  Skin: Skin is warm and dry. No rash noted.   Cardiovascular: Normal heart rate noted  Respiratory: Normal respiratory effort, no problems with respiration noted  Abdomen: Soft, gravid, appropriate for gestational age. Pain/Pressure: Absent     Pelvic:  Cervical exam deferred        Extremities: Normal range of motion.  Edema: Trace  Mental Status: Normal mood and affect. Normal behavior. Normal judgment and thought content.   Urinalysis:      Assessment and Plan:  Pregnancy: [id number] at [redacted]w[redacted]d  1. GBS bacteriuria  - Culture, OB Urine  2. Supervision of other normal pregnancy, antepartum OTC meds information provided to pt Glucola next visit F/U U/S scheduled - Culture, OB Urine  3. Multigravida of advanced maternal age in second trimester NIPS LR  Preterm labor symptoms and general obstetric precautions including but not limited to vaginal bleeding, contractions, leaking of fluid and  fetal movement were reviewed in detail with the patient. Please refer to After Visit Summary for other counseling recommendations.  Return in about 2 weeks (around 07/26/2018) for OB visit.   [name], MD

## 2018-07-12 NOTE — Patient Instructions (Signed)
Third Trimester of Pregnancy The third trimester is from week 28 through week 40 (months 7 through 9). The third trimester is a time when the unborn baby (fetus) is growing rapidly. At the end of the ninth month, the fetus is about 20 inches in length and weighs 6-10 pounds. Body changes during your third trimester Your body will continue to go through many changes during pregnancy. The changes vary from woman to woman. During the third trimester:  Your weight will continue to increase. You can expect to gain 25-35 pounds (11-16 kg) by the end of the pregnancy.  You may begin to get stretch marks on your hips, abdomen, and breasts.  You may urinate more often because the fetus is moving lower into your pelvis and pressing on your bladder.  You may develop or continue to have heartburn. This is caused by increased hormones that slow down muscles in the digestive tract.  You may develop or continue to have constipation because increased hormones slow digestion and cause the muscles that push waste through your intestines to relax.  You may develop hemorrhoids. These are swollen veins (varicose veins) in the rectum that can itch or be painful.  You may develop swollen, bulging veins (varicose veins) in your legs.  You may have increased body aches in the pelvis, back, or thighs. This is due to weight gain and increased hormones that are relaxing your joints.  You may have changes in your hair. These can include thickening of your hair, rapid growth, and changes in texture. Some women also have hair loss during or after pregnancy, or hair that feels dry or thin. Your hair will most likely return to normal after your baby is born.  Your breasts will continue to grow and they will continue to become tender. A yellow fluid (colostrum) may leak from your breasts. This is the first milk you are producing for your baby.  Your belly button may stick out.  You may notice more swelling in your hands,  face, or ankles.  You may have increased tingling or numbness in your hands, arms, and legs. The skin on your belly may also feel numb.  You may feel short of breath because of your expanding uterus.  You may have more problems sleeping. This can be caused by the size of your belly, increased need to urinate, and an increase in your body's metabolism.  You may notice the fetus "dropping," or moving lower in your abdomen (lightening).  You may have increased vaginal discharge.  You may notice your joints feel loose and you may have pain around your pelvic bone. What to expect at prenatal visits You will have prenatal exams every 2 weeks until week 36. Then you will have weekly prenatal exams. During a routine prenatal visit:  You will be weighed to make sure you and the baby are growing normally.  Your blood pressure will be taken.  Your abdomen will be measured to track your baby's growth.  The fetal heartbeat will be listened to.  Any test results from the previous visit will be discussed.  You may have a cervical check near your due date to see if your cervix has softened or thinned (effaced).  You will be tested for Group B streptococcus. This happens between 35 and 37 weeks. Your health care provider may ask you:  What your birth plan is.  How you are feeling.  If you are feeling the baby move.  If you have had any abnormal   symptoms, such as leaking fluid, bleeding, severe headaches, or abdominal cramping.  If you are using any tobacco products, including cigarettes, chewing tobacco, and electronic cigarettes.  If you have any questions. Other tests or screenings that may be performed during your third trimester include:  Blood tests that check for low iron levels (anemia).  Fetal testing to check the health, activity level, and growth of the fetus. Testing is done if you have certain medical conditions or if there are problems during the pregnancy.  Nonstress test  (NST). This test checks the health of your baby to make sure there are no signs of problems, such as the baby not getting enough oxygen. During this test, a belt is placed around your belly. The baby is made to move, and its heart rate is monitored during movement. What is false labor? False labor is a condition in which you feel small, irregular tightenings of the muscles in the womb (contractions) that usually go away with rest, changing position, or drinking water. These are called Braxton Hicks contractions. Contractions may last for hours, days, or even weeks before true labor sets in. If contractions come at regular intervals, become more frequent, increase in intensity, or become painful, you should see your health care provider. What are the signs of labor?  Abdominal cramps.  Regular contractions that start at 10 minutes apart and become stronger and more frequent with time.  Contractions that start on the top of the uterus and spread down to the lower abdomen and back.  Increased pelvic pressure and dull back pain.  A watery or bloody mucus discharge that comes from the vagina.  Leaking of amniotic fluid. This is also known as your "water breaking." It could be a slow trickle or a gush. Let your health care provider know if it has a color or strange odor. If you have any of these signs, call your health care provider right away, even if it is before your due date. Follow these instructions at home: Medicines  Follow your health care provider's instructions regarding medicine use. Specific medicines may be either safe or unsafe to take during pregnancy.  Take a prenatal vitamin that contains at least 600 micrograms (mcg) of folic acid.  If you develop constipation, try taking a stool softener if your health care provider approves. Eating and drinking   Eat a balanced diet that includes fresh fruits and vegetables, whole grains, good sources of protein such as meat, eggs, or tofu,  and low-fat dairy. Your health care provider will help you determine the amount of weight gain that is right for you.  Avoid raw meat and uncooked cheese. These carry germs that can cause birth defects in the baby.  If you have low calcium intake from food, talk to your health care provider about whether you should take a daily calcium supplement.  Eat four or five small meals rather than three large meals a day.  Limit foods that are high in fat and processed sugars, such as fried and sweet foods.  To prevent constipation: ? Drink enough fluid to keep your urine clear or pale yellow. ? Eat foods that are high in fiber, such as fresh fruits and vegetables, whole grains, and beans. Activity  Exercise only as directed by your health care provider. Most women can continue their usual exercise routine during pregnancy. Try to exercise for 30 minutes at least 5 days a week. Stop exercising if you experience uterine contractions.  Avoid heavy lifting.  Do   not exercise in extreme heat or humidity, or at high altitudes.  Wear low-heel, comfortable shoes.  Practice good posture.  You may continue to have sex unless your health care provider tells you otherwise. Relieving pain and discomfort  Take frequent breaks and rest with your legs elevated if you have leg cramps or low back pain.  Take warm sitz baths to soothe any pain or discomfort caused by hemorrhoids. Use hemorrhoid cream if your health care provider approves.  Wear a good support bra to prevent discomfort from breast tenderness.  If you develop varicose veins: ? Wear support pantyhose or compression stockings as told by your healthcare provider. ? Elevate your feet for 15 minutes, 3-4 times a day. Prenatal care  Write down your questions. Take them to your prenatal visits.  Keep all your prenatal visits as told by your health care provider. This is important. Safety  Wear your seat belt at all times when driving.  Make  a list of emergency phone numbers, including numbers for family, friends, the hospital, and police and fire departments. General instructions  Avoid cat litter boxes and soil used by cats. These carry germs that can cause birth defects in the baby. If you have a cat, ask someone to clean the litter box for you.  Do not travel far distances unless it is absolutely necessary and only with the approval of your health care provider.  Do not use hot tubs, steam rooms, or saunas.  Do not drink alcohol.  Do not use any products that contain nicotine or tobacco, such as cigarettes and e-cigarettes. If you need help quitting, ask your health care provider.  Do not use any medicinal herbs or unprescribed drugs. These chemicals affect the formation and growth of the baby.  Do not douche or use tampons or scented sanitary pads.  Do not cross your legs for long periods of time.  To prepare for the arrival of your baby: ? Take prenatal classes to understand, practice, and ask questions about labor and delivery. ? Make a trial run to the hospital. ? Visit the hospital and tour the maternity area. ? Arrange for maternity or paternity leave through employers. ? Arrange for family and friends to take care of pets while you are in the hospital. ? Purchase a rear-facing car seat and make sure you know how to install it in your car. ? Pack your hospital bag. ? Prepare the baby's nursery. Make sure to remove all pillows and stuffed animals from the baby's crib to prevent suffocation.  Visit your dentist if you have not gone during your pregnancy. Use a soft toothbrush to brush your teeth and be gentle when you floss. Contact a health care provider if:  You are unsure if you are in labor or if your water has broken.  You become dizzy.  You have mild pelvic cramps, pelvic pressure, or nagging pain in your abdominal area.  You have lower back pain.  You have persistent nausea, vomiting, or  diarrhea.  You have an unusual or bad smelling vaginal discharge.  You have pain when you urinate. Get help right away if:  Your water breaks before 37 weeks.  You have regular contractions less than 5 minutes apart before 37 weeks.  You have a fever.  You are leaking fluid from your vagina.  You have spotting or bleeding from your vagina.  You have severe abdominal pain or cramping.  You have rapid weight loss or weight gain.  You have   shortness of breath with chest pain.  You notice sudden or extreme swelling of your face, hands, ankles, feet, or legs.  Your baby makes fewer than 10 movements in 2 hours.  You have severe headaches that do not go away when you take medicine.  You have vision changes. Summary  The third trimester is from week 28 through week 40, months 7 through 9. The third trimester is a time when the unborn baby (fetus) is growing rapidly.  During the third trimester, your discomfort may increase as you and your baby continue to gain weight. You may have abdominal, leg, and back pain, sleeping problems, and an increased need to urinate.  During the third trimester your breasts will keep growing and they will continue to become tender. A yellow fluid (colostrum) may leak from your breasts. This is the first milk you are producing for your baby.  False labor is a condition in which you feel small, irregular tightenings of the muscles in the womb (contractions) that eventually go away. These are called Braxton Hicks contractions. Contractions may last for hours, days, or even weeks before true labor sets in.  Signs of labor can include: abdominal cramps; regular contractions that start at 10 minutes apart and become stronger and more frequent with time; watery or bloody mucus discharge that comes from the vagina; increased pelvic pressure and dull back pain; and leaking of amniotic fluid. This information is not intended to replace advice given to you by your  health care provider. Make sure you discuss any questions you have with your health care provider. Document Released: 05/18/2001 Document Revised: 06/29/2016 Document Reviewed: 06/29/2016 Elsevier Interactive Patient Education  2019 [employer]  

## 2018-07-12 NOTE — Progress Notes (Signed)
Pt is here for Rob.26w.

## 2018-07-16 LAB — URINE CULTURE, OB REFLEX

## 2018-07-16 LAB — CULTURE, OB URINE

## 2018-07-18 ENCOUNTER — Other Ambulatory Visit: Payer: Self-pay | Admitting: *Deleted

## 2018-07-18 MED ORDER — AMOXICILLIN-POT CLAVULANATE 875-125 MG PO TABS: 1.0000 | ORAL_TABLET | Freq: Two times a day (BID) | ORAL | 0 refills | Status: AC

## 2018-07-18 NOTE — Progress Notes (Signed)
Order sent per lab result order.

## 2018-07-20 ENCOUNTER — Ambulatory Visit (HOSPITAL_COMMUNITY)
Admission: RE | Admit: 2018-07-20 | Discharge: 2018-07-20 | Disposition: A | Discharge status: Home / Self Care | Payer: Medicaid Other | Source: Ambulatory Visit | Attending: Obstetrics | Admitting: Obstetrics

## 2018-07-20 ENCOUNTER — Encounter (HOSPITAL_COMMUNITY): Payer: Self-pay

## 2018-07-20 ENCOUNTER — Other Ambulatory Visit (HOSPITAL_COMMUNITY): Payer: Self-pay | Admitting: *Deleted

## 2018-07-20 DIAGNOSIS — O09529 Supervision of elderly multigravida, unspecified trimester: Secondary | ICD-10-CM

## 2018-07-20 DIAGNOSIS — O99212 Obesity complicating pregnancy, second trimester: Secondary | ICD-10-CM

## 2018-07-20 DIAGNOSIS — O09522 Supervision of elderly multigravida, second trimester: Secondary | ICD-10-CM | POA: Diagnosis not present

## 2018-07-20 DIAGNOSIS — Z362 Encounter for other antenatal screening follow-up: Secondary | ICD-10-CM | POA: Diagnosis not present

## 2018-07-20 DIAGNOSIS — Z3A27 27 weeks gestation of pregnancy: Secondary | ICD-10-CM

## 2018-07-20 DIAGNOSIS — O09292 Supervision of pregnancy with other poor reproductive or obstetric history, second trimester: Secondary | ICD-10-CM

## 2018-07-26 ENCOUNTER — Encounter: Payer: Self-pay | Admitting: Obstetrics & Gynecology

## 2018-07-26 ENCOUNTER — Other Ambulatory Visit: Payer: Self-pay

## 2018-07-26 ENCOUNTER — Other Ambulatory Visit (HOSPITAL_COMMUNITY)
Admission: RE | Admit: 2018-07-26 | Discharge: 2018-07-26 | Disposition: A | Discharge status: Home / Self Care | Payer: Medicaid Other | Source: Ambulatory Visit | Attending: Obstetrics & Gynecology | Admitting: Obstetrics & Gynecology

## 2018-07-26 ENCOUNTER — Ambulatory Visit (INDEPENDENT_AMBULATORY_CARE_PROVIDER_SITE_OTHER): Payer: Medicaid Other | Admitting: Obstetrics & Gynecology

## 2018-07-26 ENCOUNTER — Other Ambulatory Visit: Payer: Medicaid Other

## 2018-07-26 VITALS — BP 128/78 | HR 78 | Wt 277.7 lb

## 2018-07-26 DIAGNOSIS — O09299 Supervision of pregnancy with other poor reproductive or obstetric history, unspecified trimester: Secondary | ICD-10-CM

## 2018-07-26 DIAGNOSIS — N898 Other specified noninflammatory disorders of vagina: Secondary | ICD-10-CM | POA: Insufficient documentation

## 2018-07-26 DIAGNOSIS — O09293 Supervision of pregnancy with other poor reproductive or obstetric history, third trimester: Secondary | ICD-10-CM

## 2018-07-26 DIAGNOSIS — Z3A28 28 weeks gestation of pregnancy: Secondary | ICD-10-CM

## 2018-07-26 DIAGNOSIS — O09523 Supervision of elderly multigravida, third trimester: Secondary | ICD-10-CM

## 2018-07-26 DIAGNOSIS — Z3480 Encounter for supervision of other normal pregnancy, unspecified trimester: Secondary | ICD-10-CM

## 2018-07-26 DIAGNOSIS — Z3483 Encounter for supervision of other normal pregnancy, third trimester: Secondary | ICD-10-CM

## 2018-07-26 MED ORDER — FLUCONAZOLE 150 MG PO TABS: 150.0000 mg | ORAL_TABLET | Freq: Once | ORAL | 0 refills | Status: AC

## 2018-07-26 NOTE — Patient Instructions (Signed)

## 2018-07-26 NOTE — Progress Notes (Signed)
ROB/GTT.  Declined FLU vaccine.  C/o having itching, white, clumpy discharge x 5 days.

## 2018-07-26 NOTE — Progress Notes (Signed)
   PRENATAL VISIT NOTE  Subjective:  Courtney Woodward is a 38 y.o. G3P1102 at [redacted]w[redacted]d being seen today for ongoing prenatal care.  She is currently monitored for the following issues for this high-risk pregnancy and has GBS bacteriuria; Supervision of other normal pregnancy, antepartum; Pregnancy with history of spontaneous abortion in first trimester; Advanced maternal age in multigravida; Obesity affecting pregnancy, antepartum; and H/O macrosomia in infant in prior pregnancy, currently pregnant on their problem list.  Patient reports no complaints.  Contractions: Not present. Vag. Bleeding: None.  Movement: Present. Denies leaking of fluid.   The following portions of the patient's history were reviewed and updated as appropriate: allergies, current medications, past family history, past medical history, past social history, past surgical history and problem list. Problem list updated.  Objective:   Vitals:   07/26/18 0820  BP: 128/78  Pulse: 78  Weight: 277 lb 11.2 oz (126 kg)    Fetal Status: Fetal Heart Rate (bpm): 145 Fundal Height: 30 cm Movement: Present     General:  Alert, oriented and cooperative. Patient is in no acute distress.  Skin: Skin is warm and dry. No rash noted.   Cardiovascular: Normal heart rate noted  Respiratory: Normal respiratory effort, no problems with respiration noted  Abdomen: Soft, gravid, appropriate for gestational age.  Pain/Pressure: Absent     Pelvic: Cervical exam deferred        Extremities: Normal range of motion.  Edema: None  Mental Status: Normal mood and affect. Normal behavior. Normal judgment and thought content.   Assessment and Plan:  Pregnancy: G3P1102 at [redacted]w[redacted]d  1. Supervision of other normal pregnancy, antepartum Routine 28 week - Glucose Tolerance, 2 Hours w/1 Hour - CBC - HIV antibody (with reflex) - RPR - Cervicovaginal ancillary only( [facility])  2. Multigravida of advanced maternal age in third trimester F/u growth  [place]  3. H/O macrosomia in infant in prior pregnancy, currently pregnant 9 lb   4. Vaginal irritation Sx of yeast - Cervicovaginal ancillary only( [place]) - fluconazole (DIFLUCAN) 150 MG tablet; Take 1 tablet (150 mg total) by mouth once for 1 dose.  Dispense: 1 tablet; Refill: 0  Preterm labor symptoms and general obstetric precautions including but not limited to vaginal bleeding, contractions, leaking of fluid and fetal movement were reviewed in detail with the patient. Please refer to After Visit Summary for other counseling recommendations.  Return in about 2 weeks (around 08/09/2018).  Future Appointments  Date Time Provider Department Center  08/17/2018  9:30 AM [facility] [place] 1 [facility]    Scheryl Darter, MD

## 2018-07-27 LAB — CBC
HEMOGLOBIN: 12.4 g/dL (ref 11.1–15.9)
Hematocrit: 38.3 % (ref 34.0–46.6)
MCH: 28.8 pg (ref 26.6–33.0)
MCHC: 32.4 g/dL (ref 31.5–35.7)
MCV: 89 fL (ref 79–97)
Platelets: 278 10*3/uL (ref 150–450)
RBC: 4.3 x10E6/uL (ref 3.77–5.28)
RDW: 13.8 % (ref 11.7–15.4)
WBC: 10.5 10*3/uL (ref 3.4–10.8)

## 2018-07-27 LAB — GLUCOSE TOLERANCE, 2 HOURS W/ 1HR
GLUCOSE, 1 HOUR: 135 mg/dL (ref 65–179)
GLUCOSE, FASTING: 72 mg/dL (ref 65–91)
Glucose, 2 hour: 106 mg/dL (ref 65–152)

## 2018-07-27 LAB — HIV ANTIBODY (ROUTINE TESTING W REFLEX): HIV Screen 4th Generation wRfx: NONREACTIVE

## 2018-07-27 LAB — RPR: RPR Ser Ql: NONREACTIVE

## 2018-07-28 LAB — CERVICOVAGINAL ANCILLARY ONLY
Bacterial vaginitis: POSITIVE — AB
Candida vaginitis: POSITIVE — AB
Chlamydia: NEGATIVE
NEISSERIA GONORRHEA: NEGATIVE
Trichomonas: NEGATIVE

## 2018-07-29 ENCOUNTER — Other Ambulatory Visit: Payer: Self-pay | Admitting: Obstetrics & Gynecology

## 2018-07-29 DIAGNOSIS — Z3480 Encounter for supervision of other normal pregnancy, unspecified trimester: Secondary | ICD-10-CM

## 2018-07-29 MED ORDER — FLUCONAZOLE 150 MG PO TABS: 150.0000 mg | ORAL_TABLET | Freq: Once | ORAL | 0 refills | Status: AC

## 2018-07-29 MED ORDER — METRONIDAZOLE 500 MG PO TABS: 500.0000 mg | ORAL_TABLET | Freq: Two times a day (BID) | ORAL | 0 refills | Status: AC

## 2018-07-29 NOTE — Progress Notes (Signed)
Flagyl and diflucan Rx sent.

## 2018-08-09 ENCOUNTER — Encounter: Payer: Self-pay | Admitting: Obstetrics and Gynecology

## 2018-08-09 ENCOUNTER — Ambulatory Visit (INDEPENDENT_AMBULATORY_CARE_PROVIDER_SITE_OTHER): Payer: Medicaid Other | Admitting: Obstetrics and Gynecology

## 2018-08-09 VITALS — BP 110/71 | HR 92 | Wt 286.0 lb

## 2018-08-09 DIAGNOSIS — Z3A30 30 weeks gestation of pregnancy: Secondary | ICD-10-CM

## 2018-08-09 DIAGNOSIS — O09299 Supervision of pregnancy with other poor reproductive or obstetric history, unspecified trimester: Secondary | ICD-10-CM

## 2018-08-09 DIAGNOSIS — R8271 Bacteriuria: Secondary | ICD-10-CM

## 2018-08-09 DIAGNOSIS — Z3480 Encounter for supervision of other normal pregnancy, unspecified trimester: Secondary | ICD-10-CM

## 2018-08-09 DIAGNOSIS — O09523 Supervision of elderly multigravida, third trimester: Secondary | ICD-10-CM

## 2018-08-09 DIAGNOSIS — O09293 Supervision of pregnancy with other poor reproductive or obstetric history, third trimester: Secondary | ICD-10-CM

## 2018-08-09 NOTE — Progress Notes (Signed)
Subjective:  Courtney Woodward is a 38 y.o. G3P1102 at 102w5d being seen today for ongoing prenatal care.  She is currently monitored for the following issues for this low-risk pregnancy and has GBS bacteriuria; Supervision of other normal pregnancy, antepartum; Pregnancy with history of spontaneous abortion in first trimester; Advanced maternal age in multigravida; Obesity affecting pregnancy, antepartum; and H/O macrosomia in infant in prior pregnancy, currently pregnant on their problem list.  Patient reports no complaints.  Contractions: Not present. Vag. Bleeding: None.  Movement: Present. Denies leaking of fluid.   The following portions of the patient's history were reviewed and updated as appropriate: allergies, current medications, past family history, past medical history, past social history, past surgical history and problem list. Problem list updated.  Objective:   Vitals:   08/09/18 0901  BP: 110/71  Pulse: 92  Weight: 286 lb (129.7 kg)    Fetal Status: Fetal Heart Rate (bpm): 150   Movement: Present     General:  Alert, oriented and cooperative. Patient is in no acute distress.  Skin: Skin is warm and dry. No rash noted.   Cardiovascular: Normal heart rate noted  Respiratory: Normal respiratory effort, no problems with respiration noted  Abdomen: Soft, gravid, appropriate for gestational age. Pain/Pressure: Absent     Pelvic:  Cervical exam deferred        Extremities: Normal range of motion.     Mental Status: Normal mood and affect. Normal behavior. Normal judgment and thought content.   Urinalysis:      Assessment and Plan:  Pregnancy: G3P1102 at [redacted]w[redacted]d  1. Supervision of other normal pregnancy, antepartum Stable Requesting MDI, pt instructed to contact PCP as they prescribed before and no information in medical records of MDI Pt verbalized understanding  2. Multigravida of advanced maternal age in third trimester NIPS LR  3. H/O macrosomia in infant in prior  pregnancy, currently pregnant Growth scan later this month Nl Glucola  4. GBS bacteriuria Tx while in labor  Preterm labor symptoms and general obstetric precautions including but not limited to vaginal bleeding, contractions, leaking of fluid and fetal movement were reviewed in detail with the patient. Please refer to After Visit Summary for other counseling recommendations.  Return in about 2 weeks (around 08/23/2018) for OB visit.   [name], MD

## 2018-08-09 NOTE — Patient Instructions (Signed)
Third Trimester of Pregnancy The third trimester is from week 28 through week 40 (months 7 through 9). The third trimester is a time when the unborn baby (fetus) is growing rapidly. At the end of the ninth month, the fetus is about 20 inches in length and weighs 6-10 pounds. Body changes during your third trimester Your body will continue to go through many changes during pregnancy. The changes vary from woman to woman. During the third trimester:  Your weight will continue to increase. You can expect to gain 25-35 pounds (11-16 kg) by the end of the pregnancy.  You may begin to get stretch marks on your hips, abdomen, and breasts.  You may urinate more often because the fetus is moving lower into your pelvis and pressing on your bladder.  You may develop or continue to have heartburn. This is caused by increased hormones that slow down muscles in the digestive tract.  You may develop or continue to have constipation because increased hormones slow digestion and cause the muscles that push waste through your intestines to relax.  You may develop hemorrhoids. These are swollen veins (varicose veins) in the rectum that can itch or be painful.  You may develop swollen, bulging veins (varicose veins) in your legs.  You may have increased body aches in the pelvis, back, or thighs. This is due to weight gain and increased hormones that are relaxing your joints.  You may have changes in your hair. These can include thickening of your hair, rapid growth, and changes in texture. Some women also have hair loss during or after pregnancy, or hair that feels dry or thin. Your hair will most likely return to normal after your baby is born.  Your breasts will continue to grow and they will continue to become tender. A yellow fluid (colostrum) may leak from your breasts. This is the first milk you are producing for your baby.  Your belly button may stick out.  You may notice more swelling in your hands,  face, or ankles.  You may have increased tingling or numbness in your hands, arms, and legs. The skin on your belly may also feel numb.  You may feel short of breath because of your expanding uterus.  You may have more problems sleeping. This can be caused by the size of your belly, increased need to urinate, and an increase in your body's metabolism.  You may notice the fetus "dropping," or moving lower in your abdomen (lightening).  You may have increased vaginal discharge.  You may notice your joints feel loose and you may have pain around your pelvic bone. What to expect at prenatal visits You will have prenatal exams every 2 weeks until week 36. Then you will have weekly prenatal exams. During a routine prenatal visit:  You will be weighed to make sure you and the baby are growing normally.  Your blood pressure will be taken.  Your abdomen will be measured to track your baby's growth.  The fetal heartbeat will be listened to.  Any test results from the previous visit will be discussed.  You may have a cervical check near your due date to see if your cervix has softened or thinned (effaced).  You will be tested for Group B streptococcus. This happens between 35 and 37 weeks. Your health care provider may ask you:  What your birth plan is.  How you are feeling.  If you are feeling the baby move.  If you have had any abnormal   symptoms, such as leaking fluid, bleeding, severe headaches, or abdominal cramping.  If you are using any tobacco products, including cigarettes, chewing tobacco, and electronic cigarettes.  If you have any questions. Other tests or screenings that may be performed during your third trimester include:  Blood tests that check for low iron levels (anemia).  Fetal testing to check the health, activity level, and growth of the fetus. Testing is done if you have certain medical conditions or if there are problems during the pregnancy.  Nonstress test  (NST). This test checks the health of your baby to make sure there are no signs of problems, such as the baby not getting enough oxygen. During this test, a belt is placed around your belly. The baby is made to move, and its heart rate is monitored during movement. What is false labor? False labor is a condition in which you feel small, irregular tightenings of the muscles in the womb (contractions) that usually go away with rest, changing position, or drinking water. These are called Braxton Hicks contractions. Contractions may last for hours, days, or even weeks before true labor sets in. If contractions come at regular intervals, become more frequent, increase in intensity, or become painful, you should see your health care provider. What are the signs of labor?  Abdominal cramps.  Regular contractions that start at 10 minutes apart and become stronger and more frequent with time.  Contractions that start on the top of the uterus and spread down to the lower abdomen and back.  Increased pelvic pressure and dull back pain.  A watery or bloody mucus discharge that comes from the vagina.  Leaking of amniotic fluid. This is also known as your "water breaking." It could be a slow trickle or a gush. Let your health care provider know if it has a color or strange odor. If you have any of these signs, call your health care provider right away, even if it is before your due date. Follow these instructions at home: Medicines  Follow your health care provider's instructions regarding medicine use. Specific medicines may be either safe or unsafe to take during pregnancy.  Take a prenatal vitamin that contains at least 600 micrograms (mcg) of folic acid.  If you develop constipation, try taking a stool softener if your health care provider approves. Eating and drinking   Eat a balanced diet that includes fresh fruits and vegetables, whole grains, good sources of protein such as meat, eggs, or tofu,  and low-fat dairy. Your health care provider will help you determine the amount of weight gain that is right for you.  Avoid raw meat and uncooked cheese. These carry germs that can cause birth defects in the baby.  If you have low calcium intake from food, talk to your health care provider about whether you should take a daily calcium supplement.  Eat four or five small meals rather than three large meals a day.  Limit foods that are high in fat and processed sugars, such as fried and sweet foods.  To prevent constipation: ? Drink enough fluid to keep your urine clear or pale yellow. ? Eat foods that are high in fiber, such as fresh fruits and vegetables, whole grains, and beans. Activity  Exercise only as directed by your health care provider. Most women can continue their usual exercise routine during pregnancy. Try to exercise for 30 minutes at least 5 days a week. Stop exercising if you experience uterine contractions.  Avoid heavy lifting.  Do   not exercise in extreme heat or humidity, or at high altitudes.  Wear low-heel, comfortable shoes.  Practice good posture.  You may continue to have sex unless your health care provider tells you otherwise. Relieving pain and discomfort  Take frequent breaks and rest with your legs elevated if you have leg cramps or low back pain.  Take warm sitz baths to soothe any pain or discomfort caused by hemorrhoids. Use hemorrhoid cream if your health care provider approves.  Wear a good support bra to prevent discomfort from breast tenderness.  If you develop varicose veins: ? Wear support pantyhose or compression stockings as told by your healthcare provider. ? Elevate your feet for 15 minutes, 3-4 times a day. Prenatal care  Write down your questions. Take them to your prenatal visits.  Keep all your prenatal visits as told by your health care provider. This is important. Safety  Wear your seat belt at all times when driving.  Make  a list of emergency phone numbers, including numbers for family, friends, the hospital, and police and fire departments. General instructions  Avoid cat litter boxes and soil used by cats. These carry germs that can cause birth defects in the baby. If you have a cat, ask someone to clean the litter box for you.  Do not travel far distances unless it is absolutely necessary and only with the approval of your health care provider.  Do not use hot tubs, steam rooms, or saunas.  Do not drink alcohol.  Do not use any products that contain nicotine or tobacco, such as cigarettes and e-cigarettes. If you need help quitting, ask your health care provider.  Do not use any medicinal herbs or unprescribed drugs. These chemicals affect the formation and growth of the baby.  Do not douche or use tampons or scented sanitary pads.  Do not cross your legs for long periods of time.  To prepare for the arrival of your baby: ? Take prenatal classes to understand, practice, and ask questions about labor and delivery. ? Make a trial run to the hospital. ? Visit the hospital and tour the maternity area. ? Arrange for maternity or paternity leave through employers. ? Arrange for family and friends to take care of pets while you are in the hospital. ? Purchase a rear-facing car seat and make sure you know how to install it in your car. ? Pack your hospital bag. ? Prepare the baby's nursery. Make sure to remove all pillows and stuffed animals from the baby's crib to prevent suffocation.  Visit your dentist if you have not gone during your pregnancy. Use a soft toothbrush to brush your teeth and be gentle when you floss. Contact a health care provider if:  You are unsure if you are in labor or if your water has broken.  You become dizzy.  You have mild pelvic cramps, pelvic pressure, or nagging pain in your abdominal area.  You have lower back pain.  You have persistent nausea, vomiting, or  diarrhea.  You have an unusual or bad smelling vaginal discharge.  You have pain when you urinate. Get help right away if:  Your water breaks before 37 weeks.  You have regular contractions less than 5 minutes apart before 37 weeks.  You have a fever.  You are leaking fluid from your vagina.  You have spotting or bleeding from your vagina.  You have severe abdominal pain or cramping.  You have rapid weight loss or weight gain.  You have   shortness of breath with chest pain.  You notice sudden or extreme swelling of your face, hands, ankles, feet, or legs.  Your baby makes fewer than 10 movements in 2 hours.  You have severe headaches that do not go away when you take medicine.  You have vision changes. Summary  The third trimester is from week 28 through week 40, months 7 through 9. The third trimester is a time when the unborn baby (fetus) is growing rapidly.  During the third trimester, your discomfort may increase as you and your baby continue to gain weight. You may have abdominal, leg, and back pain, sleeping problems, and an increased need to urinate.  During the third trimester your breasts will keep growing and they will continue to become tender. A yellow fluid (colostrum) may leak from your breasts. This is the first milk you are producing for your baby.  False labor is a condition in which you feel small, irregular tightenings of the muscles in the womb (contractions) that eventually go away. These are called Braxton Hicks contractions. Contractions may last for hours, days, or even weeks before true labor sets in.  Signs of labor can include: abdominal cramps; regular contractions that start at 10 minutes apart and become stronger and more frequent with time; watery or bloody mucus discharge that comes from the vagina; increased pelvic pressure and dull back pain; and leaking of amniotic fluid. This information is not intended to replace advice given to you by your  health care provider. Make sure you discuss any questions you have with your health care provider. Document Released: 05/18/2001 Document Revised: 06/29/2016 Document Reviewed: 06/29/2016 Elsevier Interactive Patient Education  2019 [employer]  

## 2018-08-13 ENCOUNTER — Inpatient Hospital Stay (HOSPITAL_COMMUNITY)
Admission: AD | Admit: 2018-08-13 | Discharge: 2018-08-13 | Disposition: A | Discharge status: Home / Self Care | Payer: Medicaid Other | Attending: Obstetrics & Gynecology | Admitting: Obstetrics & Gynecology

## 2018-08-13 ENCOUNTER — Other Ambulatory Visit: Payer: Self-pay

## 2018-08-13 ENCOUNTER — Encounter (HOSPITAL_COMMUNITY): Payer: Self-pay

## 2018-08-13 DIAGNOSIS — Z87891 Personal history of nicotine dependence: Secondary | ICD-10-CM | POA: Insufficient documentation

## 2018-08-13 DIAGNOSIS — O26899 Other specified pregnancy related conditions, unspecified trimester: Secondary | ICD-10-CM

## 2018-08-13 DIAGNOSIS — Z79899 Other long term (current) drug therapy: Secondary | ICD-10-CM | POA: Diagnosis not present

## 2018-08-13 DIAGNOSIS — F419 Anxiety disorder, unspecified: Secondary | ICD-10-CM | POA: Diagnosis not present

## 2018-08-13 DIAGNOSIS — O99343 Other mental disorders complicating pregnancy, third trimester: Secondary | ICD-10-CM | POA: Insufficient documentation

## 2018-08-13 DIAGNOSIS — O26893 Other specified pregnancy related conditions, third trimester: Secondary | ICD-10-CM

## 2018-08-13 DIAGNOSIS — Z3A31 31 weeks gestation of pregnancy: Secondary | ICD-10-CM

## 2018-08-13 DIAGNOSIS — O99353 Diseases of the nervous system complicating pregnancy, third trimester: Secondary | ICD-10-CM | POA: Insufficient documentation

## 2018-08-13 DIAGNOSIS — G5600 Carpal tunnel syndrome, unspecified upper limb: Secondary | ICD-10-CM | POA: Insufficient documentation

## 2018-08-13 NOTE — [facility] Provider Note (Signed)
History     CSN: [id number]  Arrival date and time: 08/13/18 1536   First Provider Initiated Contact with Patient 08/13/18 1624      Chief Complaint  Patient presents with  . Numbness    in hands/arms    HPI Courtney Woodward is a 38 y.o. H6P5916 at [redacted]w[redacted]d who presents with hand and wrist numbness. She states it started 3 days ago. She reports an increase in swelling in her hands at the same time. She reports intermittent pain in her hands and wrists as well. She states it feels better when she lifts her arms over her head. She has tried icy hot and tylenol with some relief. She denies any abdominal pain, vaginal bleeding or leaking of fluid. Reports normal fetal movement.   OB History    Gravida  3   Para  2   Term  1   Preterm  1   AB  0   Living  2     SAB  0   TAB  0   Ectopic  0   Multiple  0   Live Births  2           Past Medical History:  Diagnosis Date  . 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 Anxiety   . Hx of trichomoniasis 12/2017  . PIH (pregnancy induced hypertension) 2004    Past Surgical History:  Procedure Laterality Date  . LEEP  11/2017  . OOPHORECTOMY  2004   Rt. ovary removed--Hemorrhagic Corpus Luteum Cyst    Family History  Problem Relation Age of Onset  . Stroke Mother        Hx of 3 strokes  . Cancer Mother        Dec unknown Cancer age 43  . Diabetes Mother   . Hyperlipidemia Mother     Social History   Tobacco Use  . Smoking status: Former Smoker    Last attempt to quit: 06/07/2001    Years since quitting: 17.1  . Smokeless tobacco: Never Used  Substance Use Topics  . Alcohol use: Not Currently    Alcohol/week: 0.0 standard drinks    Comment: only occ.  . Drug use: No    Allergies: No Known Allergies  Medications Prior to Admission  Medication Sig Dispense Refill Last Dose  . Prenatal Vit-Fe Fumarate-FA (PRENATAL MULTIVITAMIN) TABS tablet Take 1 tablet by mouth daily at 12 noon.   Taking     Review of Systems  Constitutional: Negative.  Negative for fatigue and fever.  HENT: Negative.   Respiratory: Negative.  Negative for shortness of breath.   Cardiovascular: Negative.  Negative for chest pain.  Gastrointestinal: Negative.  Negative for abdominal pain, constipation, diarrhea, nausea and vomiting.  Genitourinary: Negative.  Negative for dysuria, vaginal bleeding and vaginal discharge.  Neurological: Negative.  Negative for dizziness and headaches.   Physical Exam   Blood pressure 123/73, pulse 87, temperature 98.1 F (36.7 C), temperature source Oral, resp. rate 20, height 5\' 7"  (1.702 m), weight 129.1 kg, last menstrual period 01/06/2018, SpO2 99 %.  Physical Exam  Nursing note and vitals reviewed. Constitutional: She is oriented to person, place, and time. She appears well-developed and well-nourished. No distress.  HENT:  Head: Normocephalic.  Eyes: Pupils are equal, round, and reactive to light.  Cardiovascular: Normal rate, regular rhythm and normal heart sounds.  Respiratory: Effort normal and breath sounds normal. No respiratory distress.  GI: Soft. Bowel sounds are  normal. She exhibits no distension. There is no abdominal tenderness.  Neurological: She is alert and oriented to person, place, and time.  Skin: Skin is warm and dry.  Psychiatric: She has a normal mood and affect. Her behavior is normal. Judgment and thought content normal.   Fetal Tracing:  Baseline: 145 Variability: moderate Accels: 15x15 Decels: none  Toco: none  MAU Course  Procedures  MDM NST reactive  Assessment and Plan   1. Carpal tunnel syndrome during pregnancy   2. [redacted] weeks gestation of pregnancy    -Discharge home in stable condition -Reassurance given to patient of normalcy and comfort measures reviewed -Patient advised to follow-up with OB as scheduled for prenatal care -Patient may return to [facility]U as needed or if her condition were to change or  worsen   Rolm Bookbinder CNM 08/13/2018, 4:24 PM

## 2018-08-13 NOTE — Discharge Instructions (Signed)
Carpal Tunnel Syndrome  Carpal tunnel syndrome is a condition that causes pain in your hand and arm. The carpal tunnel is a narrow area located on the palm side of your wrist. Repeated wrist motion or certain diseases may cause swelling within the tunnel. This swelling pinches the main nerve in the wrist (median nerve). What are the causes? This condition may be caused by:  Repeated wrist motions.  Wrist injuries.  Arthritis.  A cyst or tumor in the carpal tunnel.  Fluid buildup during pregnancy. Sometimes the cause of this condition is not known. What increases the risk? The following factors may make you more likely to develop this condition:  Having a job, such as being a butcher or a cashier, that requires you to repeatedly move your wrist in the same motion.  Being a woman.  Having certain conditions, such as: ? Diabetes. ? Obesity. ? An underactive thyroid (hypothyroidism). ? Kidney failure. What are the signs or symptoms? Symptoms of this condition include:  A tingling feeling in your fingers, especially in your thumb, index, and middle fingers.  Tingling or numbness in your hand.  An aching feeling in your entire arm, especially when your wrist and elbow are bent for a long time.  Wrist pain that goes up your arm to your shoulder.  Pain that goes down into your palm or fingers.  A weak feeling in your hands. You may have trouble grabbing and holding items. Your symptoms may feel worse during the night. How is this diagnosed? This condition is diagnosed with a medical history and physical exam. You may also have tests, including:  Electromyogram (EMG). This test measures electrical signals sent by your nerves into the muscles.  Nerve conduction study. This test measures how well electrical signals pass through your nerves.  Imaging tests, such as X-rays, ultrasound, and MRI. These tests check for possible causes of your condition. How is this treated? This  condition may be treated with:  Lifestyle changes. It is important to stop or change the activity that caused your condition.  Doing exercise and activities to strengthen your muscles and bones (physical therapy).  Learning how to use your hand again after diagnosis (occupational therapy).  Medicines for pain and inflammation. This may include medicine that is injected into your wrist.  A wrist splint.  Surgery. Follow these instructions at home: If you have a splint:  Wear the splint as told by your health care provider. Remove it only as told by your health care provider.  Loosen the splint if your fingers tingle, become numb, or turn cold and blue.  Keep the splint clean.  If the splint is not waterproof: ? Do not let it get wet. ? Cover it with a watertight covering when you take a bath or shower. Managing pain, stiffness, and swelling   If directed, put ice on the painful area: ? If you have a removable splint, remove it as told by your health care provider. ? Put ice in a plastic bag. ? Place a towel between your skin and the bag. ? Leave the ice on for 20 minutes, 2-3 times per day. General instructions  Take over-the-counter and prescription medicines only as told by your health care provider.  Rest your wrist from any activity that may be causing your pain. If your condition is work related, talk with your employer about changes that can be made, such as getting a wrist pad to use while typing.  Do any exercises as told   by your health care provider, physical therapist, or occupational therapist.  Keep all follow-up visits as told by your health care provider. This is important. Contact a health care provider if:  You have new symptoms.  Your pain is not controlled with medicines.  Your symptoms get worse. Get help right away if:  You have severe numbness or tingling in your wrist or hand. Summary  Carpal tunnel syndrome is a condition that causes pain in  your hand and arm.  It is usually caused by repeated wrist motions.  Lifestyle changes and medicines are used to treat carpal tunnel syndrome. Surgery may be recommended.  Follow your health care provider's instructions about wearing a splint, resting from activity, keeping follow-up visits, and calling for help. This information is not intended to replace advice given to you by your health care provider. Make sure you discuss any questions you have with your health care provider. Document Released: 05/21/2000 Document Revised: 09/30/2017 Document Reviewed: 09/30/2017 Elsevier Interactive Patient Education  2019 [employer]  Hand Exercises Hand exercises can be helpful to almost anyone. These exercises can strengthen the hands, improve flexibility and movement, and increase blood flow to the hands. These results can make work and daily tasks easier. Hand exercises can be especially helpful for people who have joint pain from arthritis or have nerve damage from overuse (carpal tunnel syndrome). These exercises can also help people who have injured a hand. Most of these hand exercises are fairly gentle stretching routines. You can do them often throughout the day. Still, it is a good idea to ask your health care provider which exercises would be best for you. Warming your hands before exercise may help to reduce stiffness. You can do this with gentle massage or by placing your hands in warm water for 15 minutes. Also, make sure you pay attention to your level of hand pain as you begin an exercise routine. Exercises Knuckle bend Repeat this exercise 5-10 times with each hand. 1. Stand or sit with your arm, hand, and all five fingers pointed straight up. Make sure your wrist is straight. 2. Gently and slowly bend your fingers down and inward until the tips of your fingers are touching the tops of your palm. 3. Hold this position for a few seconds. 4. Extend your fingers out to their original  position, all pointing straight up again. Finger fan Repeat this exercise 5-10 times with each hand. 1. Hold your arm and hand out in front of you. Keep your wrist straight. 2. Squeeze your hand into a fist. 3. Hold this position for a few seconds. 4. Fan out, or spread apart, your hand and fingers as much as possible, stretching every joint fully. Tabletop Repeat this exercise 5-10 times with each hand. 1. Stand or sit with your arm, hand, and all five fingers pointed straight up. Make sure your wrist is straight. 2. Gently and slowly bend your fingers at the knuckles where they meet the hand until your hand is making an upside-down L shape. Your fingers should form a tabletop. 3. Hold this position for a few seconds. 4. Extend your fingers out to their original position, all pointing straight up again. Making Os Repeat this exercise 5-10 times with each hand. 1. Stand or sit with your arm, hand, and all five fingers pointed straight up. Make sure your wrist is straight. 2. Make an O shape by touching your pointer finger to your thumb. Hold for a few seconds. Then open your hand   wide. 3. Repeat this motion with each finger on your hand. Table spread Repeat this exercise 5-10 times with each hand. 1. Place your hand on a table with your palm facing down. Make sure your wrist is straight. 2. Spread your fingers out as much as possible. Hold this position for a few seconds. 3. Slide your fingers back together again. Hold for a few seconds. Ball grip Repeat this exercise 10-15 times with each hand. 1. Hold a tennis ball or another soft ball in your hand. 2. While slowly increasing pressure, squeeze the ball as hard as possible. 3. Squeeze as hard as you can for 3-5 seconds. 4. Relax and repeat.  Wrist curls Repeat this exercise 10-15 times with each hand. 1. Sit in a chair that has armrests. 2. Hold a light weight in your hand, such as a dumbbell that weighs 1-3 pounds (0.5-1.4 kg). Ask  your health care provider what weight would be best for you. 3. Rest your hand just over the end of the chair arm with your palm facing up. 4. Gently pivot your wrist up and down while holding the weight. Do not twist your wrist from side to side. Contact a health care provider if:  Your hand pain or discomfort gets much worse when you do an exercise.  Your hand pain or discomfort does not improve within 2 hours after you exercise. If you have any of these problems, stop doing these exercises right away. Do not do them again unless your health care provider says that you can. Get help right away if:  You develop sudden, severe hand pain. If this happens, stop doing these exercises right away. Do not do them again unless your health care provider says that you can. This information is not intended to replace advice given to you by your health care provider. Make sure you discuss any questions you have with your health care provider. Document Released: 05/05/2015 Document Revised: 09/27/2017 Document Reviewed: 12/02/2014 Elsevier Interactive Patient Education  2019 [employer]vier Inc.  

## 2018-08-13 NOTE — MAU Note (Signed)
Pt. States she has been experiencing numbness and tingling in both hands and arms for the past 3 days.  Pt denies vag bleeding or LOF and reports good fetal movement.  Pt denies any pain.

## 2018-08-16 ENCOUNTER — Telehealth: Payer: Self-pay | Admitting: *Deleted

## 2018-08-16 NOTE — Telephone Encounter (Signed)
Call placed to pt regarding Disability paperwork. Pt states she is going out of work as of now and would like to have her disability forms completed. Pt made aware that there is no mention of her work conditions noted in her chart, therefore I cannot complete paperwork. Pt advised that she will need to discuss work with provider at next visit in order to determine completion of paperwork.

## 2018-08-17 ENCOUNTER — Other Ambulatory Visit: Payer: Self-pay

## 2018-08-17 ENCOUNTER — Encounter (HOSPITAL_COMMUNITY): Payer: Self-pay

## 2018-08-17 ENCOUNTER — Other Ambulatory Visit (HOSPITAL_COMMUNITY): Payer: Self-pay | Admitting: *Deleted

## 2018-08-17 ENCOUNTER — Ambulatory Visit (HOSPITAL_COMMUNITY): Payer: Medicaid Other | Admitting: *Deleted

## 2018-08-17 ENCOUNTER — Ambulatory Visit (HOSPITAL_COMMUNITY)
Admission: RE | Admit: 2018-08-17 | Discharge: 2018-08-17 | Disposition: A | Discharge status: Home / Self Care | Payer: Medicaid Other | Source: Ambulatory Visit | Attending: Obstetrics | Admitting: Obstetrics

## 2018-08-17 VITALS — BP 119/62 | HR 83 | Wt 290.2 lb

## 2018-08-17 DIAGNOSIS — O99210 Obesity complicating pregnancy, unspecified trimester: Secondary | ICD-10-CM | POA: Diagnosis present

## 2018-08-17 DIAGNOSIS — O09293 Supervision of pregnancy with other poor reproductive or obstetric history, third trimester: Secondary | ICD-10-CM

## 2018-08-17 DIAGNOSIS — O99213 Obesity complicating pregnancy, third trimester: Secondary | ICD-10-CM | POA: Diagnosis not present

## 2018-08-17 DIAGNOSIS — R8271 Bacteriuria: Secondary | ICD-10-CM

## 2018-08-17 DIAGNOSIS — Z362 Encounter for other antenatal screening follow-up: Secondary | ICD-10-CM | POA: Diagnosis not present

## 2018-08-17 DIAGNOSIS — O09529 Supervision of elderly multigravida, unspecified trimester: Secondary | ICD-10-CM | POA: Insufficient documentation

## 2018-08-17 DIAGNOSIS — Z3A31 31 weeks gestation of pregnancy: Secondary | ICD-10-CM

## 2018-08-17 DIAGNOSIS — O09523 Supervision of elderly multigravida, third trimester: Secondary | ICD-10-CM | POA: Diagnosis not present

## 2018-08-23 ENCOUNTER — Ambulatory Visit (INDEPENDENT_AMBULATORY_CARE_PROVIDER_SITE_OTHER): Payer: Medicaid Other | Admitting: Obstetrics and Gynecology

## 2018-08-23 ENCOUNTER — Encounter: Payer: Self-pay | Admitting: Obstetrics and Gynecology

## 2018-08-23 ENCOUNTER — Other Ambulatory Visit (HOSPITAL_COMMUNITY)
Admission: RE | Admit: 2018-08-23 | Discharge: 2018-08-23 | Disposition: A | Discharge status: Home / Self Care | Payer: Medicaid Other | Source: Ambulatory Visit | Attending: Obstetrics and Gynecology | Admitting: Obstetrics and Gynecology

## 2018-08-23 ENCOUNTER — Other Ambulatory Visit: Payer: Self-pay

## 2018-08-23 VITALS — BP 121/90 | HR 90 | Wt 292.7 lb

## 2018-08-23 DIAGNOSIS — O09293 Supervision of pregnancy with other poor reproductive or obstetric history, third trimester: Secondary | ICD-10-CM

## 2018-08-23 DIAGNOSIS — O99213 Obesity complicating pregnancy, third trimester: Secondary | ICD-10-CM

## 2018-08-23 DIAGNOSIS — O99210 Obesity complicating pregnancy, unspecified trimester: Secondary | ICD-10-CM

## 2018-08-23 DIAGNOSIS — O09299 Supervision of pregnancy with other poor reproductive or obstetric history, unspecified trimester: Secondary | ICD-10-CM

## 2018-08-23 DIAGNOSIS — Z3480 Encounter for supervision of other normal pregnancy, unspecified trimester: Secondary | ICD-10-CM | POA: Diagnosis not present

## 2018-08-23 DIAGNOSIS — Z3A32 32 weeks gestation of pregnancy: Secondary | ICD-10-CM

## 2018-08-23 DIAGNOSIS — O98813 Other maternal infectious and parasitic diseases complicating pregnancy, third trimester: Secondary | ICD-10-CM

## 2018-08-23 DIAGNOSIS — R8271 Bacteriuria: Secondary | ICD-10-CM

## 2018-08-23 DIAGNOSIS — O09523 Supervision of elderly multigravida, third trimester: Secondary | ICD-10-CM

## 2018-08-23 DIAGNOSIS — Z3483 Encounter for supervision of other normal pregnancy, third trimester: Secondary | ICD-10-CM

## 2018-08-23 NOTE — Progress Notes (Signed)
   PRENATAL VISIT NOTE  Subjective:  Karenlee Fenton is a 38 y.o. G3P1102 at [redacted]w[redacted]d being seen today for ongoing prenatal care.  She is currently monitored for the following issues for this low-risk pregnancy and has GBS bacteriuria; Supervision of other normal pregnancy, antepartum; Pregnancy with history of spontaneous abortion in first trimester; Advanced maternal age in multigravida; Obesity affecting pregnancy, antepartum; and H/O macrosomia in infant in prior pregnancy, currently pregnant on their problem list.  Patient reports hand and lower extremity edema.  Contractions: Not present. Vag. Bleeding: None.  Movement: Present. Denies leaking of fluid.   The following portions of the patient's history were reviewed and updated as appropriate: allergies, current medications, past family history, past medical history, past social history, past surgical history and problem list.   Objective:   Vitals:   08/23/18 1027  BP: 121/90  Pulse: 90  Weight: 292 lb 11.2 oz (132.8 kg)    Fetal Status: Fetal Heart Rate (bpm): 145 Fundal Height: 33 cm Movement: Present     General:  Alert, oriented and cooperative. Patient is in no acute distress.  Skin: Skin is warm and dry. No rash noted.   Cardiovascular: Normal heart rate noted  Respiratory: Normal respiratory effort, no problems with respiration noted  Abdomen: Soft, gravid, appropriate for gestational age.  Pain/Pressure: Absent     Pelvic: Cervical exam deferred        Extremities: Normal range of motion.  Edema: Trace  Mental Status: Normal mood and affect. Normal behavior. Normal judgment and thought content.   Assessment and Plan:  Pregnancy: G3P1102 at [redacted]w[redacted]d 1. Supervision of other normal pregnancy, antepartum Patient is doing well  Patient and employer agreed to be on short term disability as she is not able to perform her job function Patient desires to be screen to ensure that BV and yeast infection has resolved Patient without a  history of IUFD but rather 1st trimester loss of one twin during her second pregnancy  2. Obesity affecting pregnancy, antepartum Follow up growth ultrasound  3. Multigravida of advanced maternal age in third trimester   4. GBS bacteriuria Prophylaxis in labor  5. H/O macrosomia in infant in prior pregnancy, currently pregnant   Preterm labor symptoms and general obstetric precautions including but not limited to vaginal bleeding, contractions, leaking of fluid and fetal movement were reviewed in detail with the patient. Please refer to After Visit Summary for other counseling recommendations.   Return in about 2 weeks (around 09/06/2018) for ROB.  Future Appointments  Date Time Provider Department Center  08/31/2018  1:00 PM [facility] NURSE [facility]  08/31/2018  1:00 PM [facility] [place] 3 [facility]  09/07/2018  2:45 PM [facility] NURSE [facility]  09/07/2018  2:45 PM [facility] [place] 2 [facility]  09/14/2018 10:30 AM [facility] NURSE [facility]  09/14/2018 10:30 AM [facility] [place] 1 [facility]  09/20/2018  1:15 PM Aleia Larocca, Gigi Gin, MD [facility] None    Catalina Antigua, MD

## 2018-08-23 NOTE — Progress Notes (Signed)
ROB/TOC.  C/o hands and feet being very swollen and she had to stop working 08/10/2018, she wants to be taken out of work. Self Swab done for TOC.

## 2018-08-24 ENCOUNTER — Encounter (HOSPITAL_COMMUNITY): Payer: Self-pay

## 2018-08-24 ENCOUNTER — Ambulatory Visit (HOSPITAL_COMMUNITY): Payer: Medicaid Other

## 2018-08-24 LAB — CERVICOVAGINAL ANCILLARY ONLY
Bacterial vaginitis: NEGATIVE
Candida vaginitis: NEGATIVE

## 2018-08-25 ENCOUNTER — Inpatient Hospital Stay (HOSPITAL_COMMUNITY): Payer: BLUE CROSS/BLUE SHIELD

## 2018-08-25 ENCOUNTER — Encounter (HOSPITAL_COMMUNITY): Payer: Self-pay

## 2018-08-25 ENCOUNTER — Other Ambulatory Visit: Payer: Self-pay

## 2018-08-25 ENCOUNTER — Inpatient Hospital Stay (HOSPITAL_COMMUNITY)
Admission: AD | Admit: 2018-08-25 | Discharge: 2018-09-03 | DRG: 807 | Disposition: A | Discharge status: Home / Self Care | Payer: BLUE CROSS/BLUE SHIELD | Attending: Obstetrics and Gynecology | Admitting: Obstetrics and Gynecology

## 2018-08-25 DIAGNOSIS — Z3A34 34 weeks gestation of pregnancy: Secondary | ICD-10-CM | POA: Diagnosis not present

## 2018-08-25 DIAGNOSIS — O99210 Obesity complicating pregnancy, unspecified trimester: Secondary | ICD-10-CM

## 2018-08-25 DIAGNOSIS — O42919 Preterm premature rupture of membranes, unspecified as to length of time between rupture and onset of labor, unspecified trimester: Secondary | ICD-10-CM | POA: Diagnosis present

## 2018-08-25 DIAGNOSIS — O42113 Preterm premature rupture of membranes, onset of labor more than 24 hours following rupture, third trimester: Secondary | ICD-10-CM | POA: Diagnosis not present

## 2018-08-25 DIAGNOSIS — R8271 Bacteriuria: Secondary | ICD-10-CM

## 2018-08-25 DIAGNOSIS — Z87891 Personal history of nicotine dependence: Secondary | ICD-10-CM

## 2018-08-25 DIAGNOSIS — O42913 Preterm premature rupture of membranes, unspecified as to length of time between rupture and onset of labor, third trimester: Secondary | ICD-10-CM | POA: Diagnosis not present

## 2018-08-25 DIAGNOSIS — O99824 Streptococcus B carrier state complicating childbirth: Secondary | ICD-10-CM | POA: Diagnosis present

## 2018-08-25 DIAGNOSIS — Z3A33 33 weeks gestation of pregnancy: Secondary | ICD-10-CM

## 2018-08-25 DIAGNOSIS — E669 Obesity, unspecified: Secondary | ICD-10-CM | POA: Diagnosis present

## 2018-08-25 DIAGNOSIS — O4292 Full-term premature rupture of membranes, unspecified as to length of time between rupture and onset of labor: Principal | ICD-10-CM | POA: Diagnosis present

## 2018-08-25 DIAGNOSIS — Z9189 Other specified personal risk factors, not elsewhere classified: Secondary | ICD-10-CM

## 2018-08-25 DIAGNOSIS — O09523 Supervision of elderly multigravida, third trimester: Secondary | ICD-10-CM

## 2018-08-25 DIAGNOSIS — O99213 Obesity complicating pregnancy, third trimester: Secondary | ICD-10-CM

## 2018-08-25 DIAGNOSIS — O99214 Obesity complicating childbirth: Secondary | ICD-10-CM | POA: Diagnosis present

## 2018-08-25 DIAGNOSIS — O26893 Other specified pregnancy related conditions, third trimester: Secondary | ICD-10-CM | POA: Diagnosis present

## 2018-08-25 LAB — CBC
HCT: 35.8 % — ABNORMAL LOW (ref 36.0–46.0)
Hemoglobin: 12 g/dL (ref 12.0–15.0)
MCH: 30 pg (ref 26.0–34.0)
MCHC: 33.5 g/dL (ref 30.0–36.0)
MCV: 89.5 fL (ref 80.0–100.0)
Platelets: 248 10*3/uL (ref 150–400)
RBC: 4 MIL/uL (ref 3.87–5.11)
RDW: 13.9 % (ref 11.5–15.5)
WBC: 12 10*3/uL — ABNORMAL HIGH (ref 4.0–10.5)
nRBC: 0 % (ref 0.0–0.2)

## 2018-08-25 LAB — URINALYSIS, ROUTINE W REFLEX MICROSCOPIC
Bilirubin Urine: NEGATIVE
Glucose, UA: NEGATIVE mg/dL
Ketones, ur: NEGATIVE mg/dL
Nitrite: NEGATIVE
Protein, ur: 30 mg/dL — AB
RBC / HPF: >50 RBC/hpf — ABNORMAL HIGH (ref 0–5)
Specific Gravity, Urine: 1.018 (ref 1.005–1.030)
pH: 6 (ref 5.0–8.0)

## 2018-08-25 LAB — TYPE AND SCREEN
ABO/RH(D): B POS
ANTIBODY SCREEN: NEGATIVE

## 2018-08-25 LAB — ABO/RH: ABO/RH(D): B POS

## 2018-08-25 LAB — POCT FERN TEST: POCT Fern Test: POSITIVE

## 2018-08-25 MED ORDER — DOCUSATE SODIUM 100 MG PO CAPS
100.0000 mg | ORAL_CAPSULE | Freq: Every day | ORAL | Status: DC
  Administered 2018-08-25 – 2018-08-31 (×7): 100 mg via ORAL
  Filled 2018-08-25 (×8): qty 1

## 2018-08-25 MED ORDER — ZOLPIDEM TARTRATE 5 MG PO TABS: 5.0000 mg | ORAL_TABLET | Freq: Every evening | ORAL | Status: DC | PRN

## 2018-08-25 MED ORDER — AMOXICILLIN 500 MG PO CAPS
500.0000 mg | ORAL_CAPSULE | Freq: Three times a day (TID) | ORAL | Status: AC
  Administered 2018-08-27 – 2018-08-31 (×15): 500 mg via ORAL
  Filled 2018-08-25 (×15): qty 1

## 2018-08-25 MED ORDER — AZITHROMYCIN 250 MG PO TABS
500.0000 mg | ORAL_TABLET | Freq: Every day | ORAL | Status: AC
  Administered 2018-08-25 – 2018-08-31 (×7): 500 mg via ORAL
  Filled 2018-08-25 (×8): qty 2

## 2018-08-25 MED ORDER — BETAMETHASONE SOD PHOS & ACET 6 (3-3) MG/ML IJ SUSP
12.0000 mg | INTRAMUSCULAR | Status: AC
  Administered 2018-08-25 – 2018-08-26 (×2): 12 mg via INTRAMUSCULAR
  Filled 2018-08-25 (×2): qty 2

## 2018-08-25 MED ORDER — LACTATED RINGERS IV SOLN
INTRAVENOUS | Status: DC
  Administered 2018-08-25: 11:00:00 via INTRAVENOUS

## 2018-08-25 MED ORDER — PRENATAL MULTIVITAMIN CH
1.0000 | ORAL_TABLET | Freq: Every day | ORAL | Status: DC
  Administered 2018-08-26 – 2018-08-31 (×6): 1 via ORAL
  Filled 2018-08-25 (×6): qty 1

## 2018-08-25 MED ORDER — ACETAMINOPHEN 325 MG PO TABS
650.0000 mg | ORAL_TABLET | ORAL | Status: DC | PRN
  Administered 2018-08-31: 650 mg via ORAL
  Filled 2018-08-25: qty 2

## 2018-08-25 MED ORDER — CALCIUM CARBONATE ANTACID 500 MG PO CHEW
2.0000 | CHEWABLE_TABLET | ORAL | Status: DC | PRN
  Administered 2018-08-25 – 2018-08-28 (×3): 400 mg via ORAL
  Filled 2018-08-25 (×3): qty 2

## 2018-08-25 MED ORDER — SODIUM CHLORIDE 0.9 % IV SOLN
2.0000 g | Freq: Four times a day (QID) | INTRAVENOUS | Status: AC
  Administered 2018-08-25 – 2018-08-26 (×8): 2 g via INTRAVENOUS
  Filled 2018-08-25 (×8): qty 2000

## 2018-08-25 NOTE — H&P (Addendum)
Courtney Woodward is a 38 y.o. female presenting for leaking of fluid and seeing blood when she went to the bathroom this morning around 0230.  Had a constipated stool just before bleeding.  Does not really feel any painful contractions.   Pregnancy has been followed at [facility] and remarkable for  Patient Active Problem List   Diagnosis Date Noted  . Preterm premature rupture of membranes (PPROM) with unknown onset of labor 08/25/2018  . H/O macrosomia in infant in prior pregnancy, currently pregnant 07/26/2018  . Obesity affecting pregnancy, antepartum 06/22/2018  . Advanced maternal age in multigravida 06/14/2018  . Supervision of other normal pregnancy, antepartum 05/03/2018  . Pregnancy with history of spontaneous abortion in first trimester 05/03/2018  . GBS bacteriuria 04/03/2018    . OB History    Gravida  3   Para  2   Term  1   Preterm  1   AB  0   Living  2     SAB  0   TAB  0   Ectopic  0   Multiple  0   Live Births  2          Past Medical History:  Diagnosis Date  . 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 Anxiety   . Hx of trichomoniasis 12/2017  . PIH (pregnancy induced hypertension) 2004   Past Surgical History:  Procedure Laterality Date  . LEEP  11/2017  . OOPHORECTOMY  2004   Rt. ovary removed--Hemorrhagic Corpus Luteum Cyst   Family History: family history includes Cancer in her mother; Diabetes in her mother; Hyperlipidemia in her mother; Stroke in her mother. Social History:  reports that she quit smoking about 17 years ago. She has never used smokeless tobacco. She reports previous alcohol use. She reports that she does not use drugs.     Maternal Diabetes: No Genetic Screening: Normal Maternal Ultrasounds/Referrals: Normal Fetal Ultrasounds or other Referrals:  None Maternal Substance Abuse:  No Significant Maternal Medications:  None Significant Maternal Lab Results:  Lab values include: Group B Strep positive Other  Comments:  None  Review of Systems  Constitutional: Negative for chills and fever.  Respiratory: Negative for shortness of breath.   Gastrointestinal: Positive for constipation. Negative for abdominal pain, diarrhea, nausea and vomiting.  Genitourinary: Negative for frequency.  Musculoskeletal: Negative for back pain.  Neurological: Negative for weakness.   Maternal Medical History:  Reason for admission: Rupture of membranes.  Nausea.  Contractions: Onset was 1-2 hours ago.   Frequency: irregular.   Perceived severity is mild.    Fetal activity: Perceived fetal activity is normal.   Last perceived fetal movement was within the past hour.    Prenatal complications: Bleeding (seen at home but exam negative).   No PIH, infection, polyhydramnios, pre-eclampsia or preterm labor.   Prenatal Complications - Diabetes: none.    Dilation: Closed Effacement (%): 30 Station: Ballotable Exam by:: Wynelle Bourgeois CNM  Blood pressure 120/74, pulse 91, temperature 98.3 F (36.8 C), resp. rate 19, height 5\' 7"  (1.702 m), weight 132.9 kg, last menstrual period 01/06/2018, SpO2 96 %. Maternal Exam:  Uterine Assessment: Contraction strength is mild.  Contraction frequency is irregular.   Abdomen: Patient reports no abdominal tenderness. Introitus: Normal vulva. Vagina is positive for vaginal discharge.  Ferning test: positive.  Nitrazine test: not done. Amniotic fluid character: clear.  Pelvis: adequate for delivery.   Cervix: Cervix evaluated by digital exam.  Fetal Exam Fetal Monitor Review: Mode: ultrasound.   Baseline rate: 135.  Variability: moderate (6-25 bpm).   Pattern: accelerations present and no decelerations.    Fetal State Assessment: Category I - tracings are normal.     Physical Exam  Constitutional: She is oriented to person, place, and time. She appears well-developed and well-nourished. No distress.  HENT:  Head: Normocephalic.  Cardiovascular: Normal rate  and regular rhythm.  Respiratory: Effort normal. No respiratory distress. She has no wheezes. She has no rales.  GI: Soft. She exhibits no distension. There is no abdominal tenderness. There is no rebound and no guarding.  Genitourinary:    Vulva normal.     Vaginal discharge present.     Genitourinary Comments: No pooling obvious There was wetness visible on perineum No blood seen + Ferning  Dilation: Closed Effacement (%): 30 Station: Ballotable Exam by:: Wynelle Bourgeois CNM  Felt vertex but will get [place] to confirm   Musculoskeletal: Normal range of motion.  Neurological: She is alert and oriented to person, place, and time.  Skin: Skin is warm and dry.  Psychiatric: She has a normal mood and affect.    Prenatal labs: ABO, Rh: B/Positive/-- (11/27 1609) Antibody: Negative (11/27 1609) Rubella: 2.16 (11/27 1609) RPR: Non Reactive (02/19 1016)  HBsAg: Negative (11/27 1609)  HIV: Non Reactive (02/19 1016)  GBS:     Assessment/Plan: Single intrauterine pregnancy at [redacted]w[redacted]d Preterm Premature Rupture of Membranes GBS bacteruria  Admit to Antenatal per Dr Debroah Loop Routine orders Antibiotics per protocol Betamethasone series [place] for growth and presentation     Wynelle Bourgeois 08/25/2018, 4:59 AM

## 2018-08-25 NOTE — Progress Notes (Signed)
Called to patient room after her shower. Pt showed me a scant amount of dark red blood on tissue. Clean pad placed and will continue to observe. Pt denies contractions or any pain. Infant is active. Will continue to monitor. Carmelina Dane, RN

## 2018-08-25 NOTE — MAU Note (Signed)
Reports getting up to void 20 mins ago and saw some blood.  Possibly her water broke?  No large gush-just some leaking, still feels it coming out.  + FM.  No CTX.  No complications w/ pregnancy.

## 2018-08-26 ENCOUNTER — Encounter (HOSPITAL_COMMUNITY): Payer: Self-pay | Admitting: *Deleted

## 2018-08-26 DIAGNOSIS — O42913 Preterm premature rupture of membranes, unspecified as to length of time between rupture and onset of labor, third trimester: Secondary | ICD-10-CM

## 2018-08-26 DIAGNOSIS — Z3A33 33 weeks gestation of pregnancy: Secondary | ICD-10-CM

## 2018-08-26 LAB — GC/CHLAMYDIA PROBE AMP (~~LOC~~) NOT AT ARMC
Chlamydia: NEGATIVE
Neisseria Gonorrhea: NEGATIVE

## 2018-08-26 NOTE — Consult Note (Signed)
Neonatology Consult to Antenatal Patient:  I was asked by Dr. Jolayne Panther to see this patient in order to provide antenatal counseling due to prematurity.  Ms. Labore was admitted 3/20 at 33 0/[redacted] weeks GA. She is currently not having active labor. She has received BMZ x2 and prophylactic antibiotics.  Pregnancy otherwise uncomplicated except for GBS in urine, adv maternal age and obesity.  FOB [place] of [place] though present now; she has good support.    I spoke with the patient and FOB. We discussed the worst case of delivery in the next 1-2 days, including usual DR management, possible respiratory complications and need for support, IV access, feedings (mother open to breast feeding, which was encouraged and we discussed donor breastmilk to which they agreed), LOS, Mortality and Morbidity, and long term outcomes. They did have questions at this time which I answered. We would be glad to come back if she has more questions later.  Thank you for asking me to see this patient.   Dineen Kid Leary Roca, MD Neonatologist 08/26/2018, 6:48 PM   The total length of face-to-face or floor/unit time for this encounter was 25 minutes. Counseling and/or coordination of care was 40 minutes of the above.

## 2018-08-26 NOTE — Plan of Care (Signed)
  Problem: Education: Goal: Knowledge of disease or condition will improve Outcome: Progressing   

## 2018-08-26 NOTE — Progress Notes (Signed)
Patient ID: Courtney [facility], female   DOB: [birth year], 38 y.o.   MRN: [id number] FACULTY PRACTICE ANTEPARTUM(COMPREHENSIVE) NOTE  Courtney [facility] is a 38 y.o. G3P1102 at [redacted]w[redacted]d  who is admitted for PROM.    Fetal presentation is cephalic. Length of Stay:  1  Days  Date of admission:08/25/2018  Subjective: Patient reports feeling well without complaints. She reports persistent leakage of clear fluid Patient reports the fetal movement as active. Patient reports uterine contraction  activity as none. Patient reports  vaginal bleeding as none. Patient describes fluid per vagina as clear.  Vitals:  Blood pressure 100/61, pulse 68, temperature 98 F (36.7 C), temperature source Oral, resp. rate 18, height  (1.702 m), weight 132.9 kg, last menstrual period 01/06/2018, SpO2 97 %. Vitals:   08/25/18 2004 08/25/18 2320 08/26/18 0513 08/26/18 0740  BP: 108/85 (!) 123/55 (!) 110/58 100/61  Pulse: 81 81 80 68  Resp: Temp: 98 F (36.7 C) 97.9 F (36.6 C) 97.8 F (36.6 C) 98 F (36.7 C)  TempSrc: Oral Oral Oral Oral  SpO2: 100% 100% 97% 97%  Weight:      Height:       Physical Examination: GENERAL: Well-developed, well-nourished female in no acute distress.  LUNGS: Clear to auscultation bilaterally.  HEART: Regular rate and rhythm. ABDOMEN: Soft, nontender, gravid PELVIC: Not indicated EXTREMITIES: No cyanosis, clubbing, or edema, 2+ distal pulses.   Fetal Monitoring:  Baseline: 135 bpm, Variability: Good {> 6 bpm), Accelerations: Reactive, Decelerations: Absent and Toco: no contractions     Labs:  No results found for this or any previous visit (from the past 24 hour(s)).  Imaging Studies:    [place] Mfm Ob Follow Up  Result Date: 08/17/2018 ----------------------------------------------------------------------  OBSTETRICS REPORT                       (Signed Final 08/17/2018 03:49 pm) ---------------------------------------------------------------------- Patient Info  ID #:        [id number]                          D.O.B.:  06-Mar-1981 (38 yrs)  Name:       Courtney Woodward                    Visit Date: 08/17/2018 09:27 am ---------------------------------------------------------------------- Performed By  Performed By:     Apolonio Schneiders Sweat         Ref. Address:      [address]                                                              [phone]  [place]                                                              [address]  Attending:        Lin Landsman      Location:          [facility]                    MD                                        [facility]  Referred By:      [facility]                    [facility] ---------------------------------------------------------------------- Orders   #  Description                          Code         Ordered By   1  [place] MFM OB FOLLOW UP                  60454.09     Lin Landsman  ----------------------------------------------------------------------   #  Order #                    Accession #                 Episode #   1  [id number]                  [id number]                  [id number]  ---------------------------------------------------------------------- Indications   Advanced maternal age multigravida 79+,        O46.522   second trimester (low risk NIPS, AFP not   done)   Obesity complicating pregnancy, second         O99.212   trimester( BMI>40)   Poor obstetric history: Previous IUFD          O1.299   (stillbirth) (one of the twins)   [redacted] weeks gestation of pregnancy                Z3A.31  ---------------------------------------------------------------------- Vital Signs                                                 Height:        5'7"  ---------------------------------------------------------------------- Fetal Evaluation  Num Of Fetuses:          1  Fetal Heart Rate(bpm):   145  Cardiac Activity:        Observed  Presentation:            Cephalic  Placenta:                Posterior  P. Cord Insertion:       Previously Visualized  AFI Sum(cm)     %Tile       Largest Pocket(cm)  14.89           52          5.33  RUQ(cm)       RLQ(cm)       LUQ(cm)        LLQ(cm)  4.16          1.84          3.56           5.33 ---------------------------------------------------------------------- Biometry  BPD:      78.9  mm     G. Age:  31w 5d         35  %    CI:        75.84   %    70 - 86                                                          FL/HC:       21.4  %    19.1 - 21.3  HC:      287.2  mm     G. Age:  31w 4d         11  %    HC/AC:       1.03       0.96 - 1.17  AC:      278.9  mm     G. Age:  32w 0d         51  %    FL/BPD:      77.8  %    71 - 87  FL:       61.4  mm     G. Age:  31w 6d         38  %    FL/AC:       22.0  %    20 - 24  HUM:      54.3  mm     G. Age:  31w 4d         45  %  Est. FW:    1852   gm     4 lb 1 oz     56  % ---------------------------------------------------------------------- OB History  Gravidity:    4         Term:   3        Prem:   0        SAB:   1  TOP:          0        Living:  2 ---------------------------------------------------------------------- Gestational Age  LMP:           31w 6d        Date:  01/06/18                 EDD:   10/13/18  U/S  Today:     31w 6d                                        EDD:   10/13/18  Best:          31w 6d     Det. By:  LMP  (01/06/18)          EDD:   10/13/18 ---------------------------------------------------------------------- Anatomy  Cranium:               Appears normal         Aortic Arch:            Previously seen  Cavum:                 Previously seen        Ductal Arch:            Previously seen  Ventricles:            Previously seen        Diaphragm:               Previously seen  Choroid Plexus:        Previously seen        Stomach:                Appears normal, left                                                                        sided  Cerebellum:            Previously seen        Abdomen:                Appears normal  Posterior Fossa:       Previously seen        Abdominal Wall:         Previously seen  Nuchal Fold:           Not applicable (>20    Cord Vessels:           Previously seen                         wks GA)  Face:                  Orbits and profile     Kidneys:                Appear normal                         previously seen  Lips:                  Previously seen        Bladder:                Appears normal  Thoracic:              Appears normal         Spine:  Previously seen  Heart:                 Previously seen        Upper Extremities:      Previously seen  RVOT:                  Not well visualized    Lower Extremities:      Previously seen  LVOT:                  Not well visualized  Other:  Fetus appears to be a female. Heels and 5th digit prv visualized. Nasal          bone prv visualized. Technically difficult due to advanced GA and          fetal position. ---------------------------------------------------------------------- Impression  Normal interval growth.  Prior IUFD of twin pregnancy ---------------------------------------------------------------------- Recommendations  Initiate weekly BPP given prior IUFD  Follow up growth in 4 weeks. ----------------------------------------------------------------------               Lin Landsman, MD Electronically Signed Final Report   08/17/2018 03:49 pm ----------------------------------------------------------------------  [place] Mfm Ob Limited  Result Date: 08/25/2018 ----------------------------------------------------------------------  OBSTETRICS REPORT                       (Signed Final 08/25/2018 12:15 pm)  ---------------------------------------------------------------------- Patient Info  ID #:       [id number]                          D.O.B.:  12-31-1980 (38 yrs)  Name:       Courtney [facility]                    Visit Date: 08/25/2018 09:21 am ---------------------------------------------------------------------- Performed By  Performed By:     Hurman Horn          Ref. Address:     [address]                                                             [address]                                                             [place]  [address]  Attending:        Noralee Space MD        [facility]  Referred By:      [facility]                    [facility]                    [facility] ---------------------------------------------------------------------- Orders   #  Description                          Code         Ordered By   1  [place] MFM OB LIMITED                    [phone]     Wynelle Bourgeois  ----------------------------------------------------------------------   #  Order #                    Accession #                 Episode #   1  [id number]                  [id number]                  [id number]  ---------------------------------------------------------------------- Indications   Maternal morbid obesity                        O99.210 E66.01   Premature rupture of membranes - leaking       O42.90   fluid (+ fern)   [redacted] weeks gestation of pregnancy                Z3A.33  ---------------------------------------------------------------------- Vital Signs                                                 Height:        5'7" ---------------------------------------------------------------------- Fetal Evaluation  Num Of Fetuses:         1  Fetal Heart Rate(bpm):  152  Cardiac  Activity:       Observed  Presentation:           Cephalic  Placenta:               Posterior  Amniotic Fluid  AFI FV:      Within normal limits  AFI Sum(cm)     %Tile       Largest Pocket(cm)  12.71           38          4.1  RUQ(cm)       RLQ(cm)       LUQ(cm)        LLQ(cm)  2.51          2.12  4.1            3.98  Comment:    Stomach, bladder, diaphragm noted. ---------------------------------------------------------------------- OB History  Gravidity:    4         Term:   3        Prem:   0        SAB:   1  TOP:          0        Living:  2 ---------------------------------------------------------------------- Gestational Age  LMP:           33w 0d        Date:  01/06/18                 EDD:   10/13/18  Best:          33w 0d     Det. By:  LMP  (01/06/18)          EDD:   10/13/18 ---------------------------------------------------------------------- Impression  Patient is admitted with the diagnosis of PPROM. A limited  ultrasound study was performed. Amniotic fluid is normal and  good fetal activity is seen. Cephalic presentation. ---------------------------------------------------------------------- Recommendations  -Decision to deliver should be based on clinical diagnosis of  PPROM. Amniotic fluid may be normal on ultrasound in  patients with PPROM. ----------------------------------------------------------------------                  Noralee Space, MD Electronically Signed Final Report   08/25/2018 12:15 pm ----------------------------------------------------------------------    Medications:  Scheduled . [START ON 08/27/2018] amoxicillin  500 mg Oral Q8H  . azithromycin  500 mg Oral Daily  . docusate sodium  100 mg Oral Daily  . prenatal multivitamin  1 tablet Oral Q1200   I have reviewed the patient's current medications.  ASSESSMENT: [id number] [redacted]w[redacted]d Estimated Date of Delivery: 10/13/18  Patient Active Problem List   Diagnosis Date Noted  . Preterm premature rupture of membranes (PPROM)  with unknown onset of labor 08/25/2018  . H/O macrosomia in infant in prior pregnancy, currently pregnant 07/26/2018  . Obesity affecting pregnancy, antepartum 06/22/2018  . Advanced maternal age in multigravida 06/14/2018  . Supervision of other normal pregnancy, antepartum 05/03/2018  . Pregnancy with history of spontaneous abortion in first trimester 05/03/2018  . GBS bacteriuria 04/03/2018    PLAN: - Patient completed BMZ this morning - Continue latency antibiotics - Continue monitoring for si/sx of chorioamnionitis - Continue inpatient observation - delivery by 34 weeks   Kodi Steil 08/26/2018,9:38 AM

## 2018-08-27 MED ORDER — TETANUS-DIPHTH-ACELL PERTUSSIS 5-2.5-18.5 LF-MCG/0.5 IM SUSP
0.5000 mL | Freq: Once | INTRAMUSCULAR | Status: AC
  Administered 2018-08-28: 0.5 mL via INTRAMUSCULAR
  Filled 2018-08-27: qty 0.5

## 2018-08-27 NOTE — Progress Notes (Signed)
Patient ID: Lukisha Procida, female   DOB: 12-10-1980, 38 y.o.   MRN: [id number] FACULTY PRACTICE ANTEPARTUM(COMPREHENSIVE) NOTE  Leanore Biggers is a 38 y.o. G3P1102 at [redacted]w[redacted]d  who is admitted for PROM.    Fetal presentation is cephalic. Length of Stay:  2  Days  Date of admission:08/25/2018  Subjective: Patient reports feeling well without complaints. She reports persistent leakage of clear fluid Patient reports the fetal movement as active. Patient reports uterine contraction  activity as none. Patient reports  vaginal bleeding as none. Patient describes fluid per vagina as clear.  Vitals:  Blood pressure 110/61, pulse 73, temperature 97.9 F (36.6 C), temperature source Oral, resp. rate 18, height  (1.702 m), weight 132.9 kg, last menstrual period 01/06/2018, SpO2 98 %. Vitals:   08/26/18 1551 08/26/18 1553 08/26/18 1952 08/27/18 0508  BP: 107/61 107/61 (!) 106/54 110/61  Pulse: 81 80 71 73  Resp: Temp: 97.8 F (36.6 C)  97.8 F (36.6 C) 97.9 F (36.6 C)  TempSrc: Oral  Oral Oral  SpO2: 99%   98%  Weight:      Height:       Physical Examination: GENERAL: Well-developed, well-nourished female in no acute distress.  LUNGS: Clear to auscultation bilaterally.  HEART: Regular rate and rhythm. ABDOMEN: Soft, nontender, gravid PELVIC: Not indicated EXTREMITIES: No cyanosis, clubbing, or edema, 2+ distal pulses.   Fetal Monitoring:  Baseline: 140 bpm, Variability: Good {> 6 bpm), Accelerations: Reactive, Decelerations: Absent and TOCO: no contractions     Labs:  No results found for this or any previous visit (from the past 24 hour(s)).  Imaging Studies:    [place] Mfm Ob Follow Up  Result Date: 08/17/2018 ----------------------------------------------------------------------  OBSTETRICS REPORT                       (Signed Final 08/17/2018 03:49 pm) ---------------------------------------------------------------------- Patient Info  ID #:       [id number]                           D.O.B.:  [birth year] (38 yrs)  Name:       [facility]                    Visit Date: 08/17/2018 09:27 am ---------------------------------------------------------------------- Performed By  Performed By:     Apolonio Schneiders Sweat         Ref. Address:      [address]                                                              [phone]  [place]                                                              [address]  Attending:        Lin Landsman      [facility]  Referred By:      [facility]                    [facility]                    [facility] ---------------------------------------------------------------------- Orders   #  Description                          Code         Ordered By   1  [place] MFM OB FOLLOW UP                  60454.09     Lin Landsman  ----------------------------------------------------------------------   #  Order #                    Accession #                 Episode #   1  [id number]                  [id number]                  [id number]  ---------------------------------------------------------------------- Indications   Advanced maternal age multigravida 83+,        O46.522   second trimester (low risk NIPS, AFP not   done)   Obesity complicating pregnancy, second         O99.212   trimester( BMI>40)   Poor obstetric history: Previous IUFD          O1.299   (stillbirth) (one of the twins)   [redacted] weeks gestation of pregnancy                Z3A.31  ---------------------------------------------------------------------- Vital Signs                                                 Height:        5'7" ----------------------------------------------------------------------  Fetal Evaluation  Num Of Fetuses:          1  Fetal Heart Rate(bpm):   145  Cardiac Activity:        Observed  Presentation:            Cephalic  Placenta:                Posterior  P. Cord Insertion:       Previously Visualized  AFI Sum(cm)     %Tile       Largest Pocket(cm)  14.89           52          5.33  RUQ(cm)       RLQ(cm)       LUQ(cm)        LLQ(cm)  4.16          1.84          3.56           5.33 ---------------------------------------------------------------------- Biometry  BPD:      78.9  mm     G. Age:  31w 5d         35  %    CI:        75.84   %    70 - 86                                                          FL/HC:       21.4  %    19.1 - 21.3  HC:      287.2  mm     G. Age:  31w 4d         11  %    HC/AC:       1.03       0.96 - 1.17  AC:      278.9  mm     G. Age:  32w 0d         51  %    FL/BPD:      77.8  %    71 - 87  FL:       61.4  mm     G. Age:  31w 6d         38  %    FL/AC:       22.0  %    20 - 24  HUM:      54.3  mm     G. Age:  31w 4d         45  %  Est. FW:    1852   gm     4 lb 1 oz     56  % ---------------------------------------------------------------------- OB History  Gravidity:    4         Term:   3        Prem:   0        SAB:   1  TOP:          0        Living:  2 ---------------------------------------------------------------------- Gestational Age  LMP:           31w 6d        Date:  01/06/18                 EDD:   10/13/18  U/S  Today:     31w 6d                                        EDD:   10/13/18  Best:          31w 6d     Det. By:  LMP  (01/06/18)          EDD:   10/13/18 ---------------------------------------------------------------------- Anatomy  Cranium:               Appears normal         Aortic Arch:            Previously seen  Cavum:                 Previously seen        Ductal Arch:            Previously seen  Ventricles:            Previously seen        Diaphragm:              Previously seen  Choroid Plexus:        Previously seen        Stomach:                 Appears normal, left                                                                        sided  Cerebellum:            Previously seen        Abdomen:                Appears normal  Posterior Fossa:       Previously seen        Abdominal Wall:         Previously seen  Nuchal Fold:           Not applicable (>20    Cord Vessels:           Previously seen                         wks GA)  Face:                  Orbits and profile     Kidneys:                Appear normal                         previously seen  Lips:                  Previously seen        Bladder:                Appears normal  Thoracic:              Appears normal         Spine:  Previously seen  Heart:                 Previously seen        Upper Extremities:      Previously seen  RVOT:                  Not well visualized    Lower Extremities:      Previously seen  LVOT:                  Not well visualized  Other:  Fetus appears to be a female. Heels and 5th digit prv visualized. Nasal          bone prv visualized. Technically difficult due to advanced GA and          fetal position. ---------------------------------------------------------------------- Impression  Normal interval growth.  Prior IUFD of twin pregnancy ---------------------------------------------------------------------- Recommendations  Initiate weekly BPP given prior IUFD  Follow up growth in 4 weeks. ----------------------------------------------------------------------               Lin Landsman, MD Electronically Signed Final Report   08/17/2018 03:49 pm ----------------------------------------------------------------------  [place] Mfm Ob Limited  Result Date: 08/25/2018 ----------------------------------------------------------------------  OBSTETRICS REPORT                       (Signed Final 08/25/2018 12:15 pm) ---------------------------------------------------------------------- Patient Info  ID #:       [id number]                           D.O.B.:  1980-11-13 (38 yrs)  Name:       [facility]                    Visit Date: 08/25/2018 09:21 am ---------------------------------------------------------------------- Performed By  Performed By:     Hurman Horn          Ref. Address:     [address]                                                             [address]                                                             [place]  [id number]  Attending:        Noralee Spaceavi Shankar MD        [facility]  [facility]erred By[facility]      [facility]                    [facility] ---------------------------------------------------------------------- Orders   #  Description                          Code         Ordered By   1  US MFM OB LIMITED                    [id number]     Wynelle BourgeoisMARIE WILLIAMS  ----------------------------------------------------------------------   #  Order #                    Accession #                 Episode #   1  [id number]                  [id number]                  [id number]  ---------------------------------------------------------------------- Indications   Maternal morbid obesity                        O99.210 E66.01   Premature rupture of membranes - leaking       O42.90   fluid (+ fern)   [redacted] weeks gestation of pregnancy                Z3A.33  ---------------------------------------------------------------------- Vital Signs                                                 Height:        5'7" ---------------------------------------------------------------------- Fetal Evaluation  Num Of Fetuses:         1  Fetal Heart Rate(bpm):  152  Cardiac Activity:       Observed  Presentation:           Cephalic  Placenta:               Posterior  Amniotic Fluid  AFI FV:      Within  normal limits  AFI Sum(cm)     %Tile       Largest Pocket(cm)  12.71           38          4.1  RUQ(cm)       RLQ(cm)       LUQ(cm)        LLQ(cm)  2.51          2.12  4.1            3.98  Comment:    Stomach, bladder, diaphragm noted. ---------------------------------------------------------------------- OB History  Gravidity:    4         Term:   3        Prem:   0        SAB:   1  TOP:          0        Living:  2 ---------------------------------------------------------------------- Gestational Age  LMP:           33w 0d        Date:  01/06/18                 EDD:   10/13/18  Best:          33w 0d     Det. By:  LMP  (01/06/18)          EDD:   10/13/18 ---------------------------------------------------------------------- Impression  Patient is admitted with the diagnosis of PPROM. A limited  ultrasound study was performed. Amniotic fluid is normal and  good fetal activity is seen. Cephalic presentation. ---------------------------------------------------------------------- Recommendations  -Decision to deliver should be based on clinical diagnosis of  PPROM. Amniotic fluid may be normal on ultrasound in  patients with PPROM. ----------------------------------------------------------------------                  Noralee Space, MD Electronically Signed Final Report   08/25/2018 12:15 pm ----------------------------------------------------------------------    Medications:  Scheduled . amoxicillin  500 mg Oral Q8H  . azithromycin  500 mg Oral Daily  . docusate sodium  100 mg Oral Daily  . prenatal multivitamin  1 tablet Oral Q1200   I have reviewed the patient's current medications.  ASSESSMENT: [id number] [redacted]w[redacted]d Estimated Date of Delivery: 10/13/18  Patient Active Problem List   Diagnosis Date Noted  . Preterm premature rupture of membranes (PPROM) with unknown onset of labor 08/25/2018  . H/O macrosomia in infant in prior pregnancy, currently pregnant 07/26/2018  . Obesity affecting pregnancy,  antepartum 06/22/2018  . Advanced maternal age in multigravida 06/14/2018  . Supervision of other normal pregnancy, antepartum 05/03/2018  . Pregnancy with history of spontaneous abortion in first trimester 05/03/2018  . GBS bacteriuria 04/03/2018    PLAN: - Patient completed BMZ - Continue latency antibiotics - Continue monitoring for si/sx of chorioamnionitis - Continue inpatient observation - delivery by 34 weeks   Tharon Bomar 08/27/2018,7:58 AM

## 2018-08-28 LAB — TYPE AND SCREEN
ABO/RH(D): B POS
Antibody Screen: NEGATIVE

## 2018-08-28 MED ORDER — SODIUM CHLORIDE 0.9 % IV BOLUS
250.0000 mL | Freq: Once | INTRAVENOUS | Status: AC
  Administered 2018-08-28: 250 mL via INTRAVENOUS

## 2018-08-28 NOTE — Progress Notes (Signed)
Patient ID: Courtney Woodward, female   DOB: 27-Oct-1980, 38 y.o.   MRN: [id number] ACULTY PRACTICE ANTEPARTUM COMPREHENSIVE PROGRESS NOTE  Courtney Woodward is a 38 y.o. G3P1102 at [redacted]w[redacted]d  who is admitted for PROM.   Fetal presentation is cephalic. Length of Stay:  3  Days  Subjective: [name] has no complaints this morning. Still occ LOF Patient reports good fetal movement.  She reports no uterine contractions, no bleeding and no loss of fluid per vagina.  Vitals:  Blood pressure 120/80, pulse 74, temperature 98 F (36.7 C), temperature source Oral, resp. rate 17, height 5\' 7"  (1.702 m), weight 132.9 kg, last menstrual period 01/06/2018, SpO2 97 %.   Physical Examination: Lungs clear Heart RRR Abd soft + BS gravid non tender Ext non tender  Fetal Monitoring: Baseline 120-130's , + Accels  Good variability Reactive No decels or ut ctx   Labs:  Results for orders placed or performed during the hospital encounter of 08/25/18 (from the past 24 hour(s))  Type and screen [facility]   Collection Time: 08/28/18  5:48 AM  Result Value Ref Range   ABO/RH(D) B POS    Antibody Screen NEG    Sample Expiration      08/31/2018 Performed at [facility], [address], [place], [place] [address]     Imaging Studies:      Medications:  Scheduled . amoxicillin  500 mg Oral Q8H  . azithromycin  500 mg Oral Daily  . docusate sodium  100 mg Oral Daily  . prenatal multivitamin  1 tablet Oral Q1200   I have reviewed the patient's current medications.  ASSESSMENT: Patient Active Problem List   Diagnosis Date Noted  . Preterm premature rupture of membranes (PPROM) with unknown onset of labor 08/25/2018  . H/O macrosomia in infant in prior pregnancy, currently pregnant 07/26/2018  . Obesity affecting pregnancy, antepartum 06/22/2018  . Advanced maternal age in multigravida 06/14/2018  . Supervision of other normal pregnancy, antepartum 05/03/2018  . Pregnancy with history of  spontaneous abortion in first trimester 05/03/2018  . GBS bacteriuria 04/03/2018    PLAN: Stable. No S/Sx of infection. Continue with latency antibiotics. Delivery at 34 weeks or for maternal fetal indications. Continue routine antenatal care.   [name] 08/28/2018,10:22 AM

## 2018-08-28 NOTE — Progress Notes (Signed)
While completing NST, pt began c/o feeling contractions.  Pt voided and still felt contractions with bladder empty.  Spoke to Dr. Alysia Penna.  See order for NS bolus.  NS Bolus given at 12:36.  By 13:23 pt said she still felt a few contractions, but they were milder than before.  FHR reassuring throughout monitoring, so removed monitors.  At 14:30, pt sitting up on couch eating lunch and said she was not feeling any contractions.

## 2018-08-29 NOTE — Progress Notes (Signed)
Patient ID: Courtney Woodward, female   DOB: 08-22-1980, 38 y.o.   MRN: [id number] ACULTY PRACTICE ANTEPARTUM COMPREHENSIVE PROGRESS NOTE  Courtney Woodward is a 38 y.o. G3P1102 at [redacted]w[redacted]d  who is admitted for PROM.   Fetal presentation is cephalic. Length of Stay:  4  Days  Subjective: Pt has no complaints this morning. LOF at times Patient reports good fetal movement.  She reports no uterine contractions, no bleeding.  Vitals:  Blood pressure 116/68, pulse 66, temperature 98 F (36.7 C), temperature source Oral, resp. rate 18, height 5\' 7"  (1.702 m), weight 132.9 kg, last menstrual period 01/06/2018, SpO2 99 %.   Physical Examination: Lungs clear Heart RRR Abd soft + BS gravid non tender Ext non tedner  Fetal Monitoring:  BAseline 130's + Accels, occ ut ctx, good variability  Labs:  No results found for this or any previous visit (from the past 24 hour(s)).  Imaging Studies:       Medications:  Scheduled . amoxicillin  500 mg Oral Q8H  . azithromycin  500 mg Oral Daily  . docusate sodium  100 mg Oral Daily  . prenatal multivitamin  1 tablet Oral Q1200   I have reviewed the patient's current medications.  ASSESSMENT: Patient Active Problem List   Diagnosis Date Noted  . Preterm premature rupture of membranes (PPROM) with unknown onset of labor 08/25/2018  . H/O macrosomia in infant in prior pregnancy, currently pregnant 07/26/2018  . Obesity affecting pregnancy, antepartum 06/22/2018  . Advanced maternal age in multigravida 06/14/2018  . Supervision of other normal pregnancy, antepartum 05/03/2018  . Pregnancy with history of spontaneous abortion in first trimester 05/03/2018  . GBS bacteriuria 04/03/2018    PLAN: Stable. Continue with latency antibiotics. Delivery for maternal/fetal indications or at 34 weeks. IOL scheduled for this Friday Continue routine antenatal care.   [name] 08/29/2018,10:45 AM

## 2018-08-30 MED ORDER — SODIUM CHLORIDE 0.9% FLUSH
3.0000 mL | Freq: Two times a day (BID) | INTRAVENOUS | Status: DC
  Administered 2018-08-30 – 2018-08-31 (×3): 3 mL via INTRAVENOUS

## 2018-08-30 NOTE — Progress Notes (Signed)
Patient ID: Courtney Woodward, female   DOB: [birth year], 38 y.o.   MRN: [id number] ACULTY PRACTICE ANTEPARTUM COMPREHENSIVE PROGRESS NOTE  Courtney Woodward is a 38 y.o. G3P1102 at [redacted]w[redacted]d  who is admitted for PROM.   Fetal presentation is cephalic. Length of Stay:  5  Days  Subjective: Courtney Woodward has no new complaints today Patient reports good fetal movement.    Vitals:  Blood pressure (!) 108/47, pulse 66, temperature 98.4 F (36.9 C), temperature source Oral, resp. rate 20, height 5\' 7"  (1.702 m), weight 132.9 kg, last menstrual period 01/06/2018, SpO2 98 %.   Physical Examination: Lungs clear Heart RRR Abd soft + BS gravid non tender Ext non tender  Fetal Monitoring:  120-130's baseline + Accels, no decles, no ut ctx, good variablity  Labs:  No results found for this or any previous visit (from the past 24 hour(s)).  Imaging Studies:    none   Medications:  Scheduled . amoxicillin  500 mg Oral Q8H  . azithromycin  500 mg Oral Daily  . docusate sodium  100 mg Oral Daily  . prenatal multivitamin  1 tablet Oral Q1200   I have reviewed the patient's current medications.  ASSESSMENT: Patient Active Problem List   Diagnosis Date Noted  . Preterm premature rupture of membranes (PPROM) with unknown onset of labor 08/25/2018  . H/O macrosomia in infant in prior pregnancy, currently pregnant 07/26/2018  . Obesity affecting pregnancy, antepartum 06/22/2018  . Advanced maternal age in multigravida 06/14/2018  . Supervision of other normal pregnancy, antepartum 05/03/2018  . Pregnancy with history of spontaneous abortion in first trimester 05/03/2018  . GBS bacteriuria 04/03/2018    PLAN: Stable.Continue with current management   [name] 08/30/2018,12:00 PM

## 2018-08-31 ENCOUNTER — Ambulatory Visit (HOSPITAL_COMMUNITY): Payer: Medicaid Other

## 2018-08-31 LAB — TYPE AND SCREEN
ABO/RH(D): B POS
Antibody Screen: NEGATIVE

## 2018-08-31 MED ORDER — NYSTATIN 100000 UNIT/ML MT SUSP
5.0000 mL | Freq: Four times a day (QID) | OROMUCOSAL | Status: DC
  Administered 2018-08-31 (×4): 500000 [IU] via ORAL
  Filled 2018-08-31 (×5): qty 5

## 2018-08-31 MED ORDER — PANTOPRAZOLE SODIUM 40 MG PO TBEC: 40.0000 mg | DELAYED_RELEASE_TABLET | Freq: Every day | ORAL | Status: DC

## 2018-08-31 NOTE — Progress Notes (Signed)
Patient ID: Courtney Woodward, female   DOB: Dec 02, 1980, 38 y.o.   MRN: [id number] FACULTY PRACTICE ANTEPARTUM(COMPREHENSIVE) NOTE  Courtney Woodward is a 38 y.o. [id number] with Estimated Date of Delivery: 10/13/18   By   [redacted]w[redacted]d  who is admitted for PROM.    Fetal presentation is cephalic. Length of Stay:  6  Days  Date of admission:08/25/2018  Subjective: Sore tender tongue Patient reports the fetal movement as active. Patient reports uterine contraction  activity as none. Patient reports  vaginal bleeding as none. Patient describes fluid per vagina as Clear.  Vitals:  Blood pressure (!) 113/57, pulse 79, temperature 98 F (36.7 C), temperature source Oral, resp. rate 18, height 5\' 7"  (1.702 m), weight 132.9 kg, last menstrual period 01/06/2018, SpO2 99 %. Vitals:   08/30/18 1616 08/30/18 2020 08/31/18 0016 08/31/18 0435  BP: 118/60 120/86 108/65 (!) 113/57  Pulse: 79 81 86 79  Resp: 20 18 18 18   Temp: 98.1 F (36.7 C) 97.9 F (36.6 C) 98 F (36.7 C) 98 F (36.7 C)  TempSrc: Oral Oral Oral Oral  SpO2: 99% 100% 98% 99%  Weight:      Height:       Physical Examination:  General appearance - alert, well appearing, and in no distress Abdomen - soft, nontender, nondistended, no masses or organomegaly Fundal Height:  size equals dates Pelvic Exam:  examination not indicated Cervical Exam: Not evaluated.. Extremities: extremities normal, atraumatic, no cyanosis or edema with DTRs 2+ bilaterally Membranes:ruptured, clear fluid  Fetal Monitoring:  Baseline: 140 bpm, Variability: Good {> 6 bpm), Accelerations: Reactive and Decelerations: Absent   reactive  Labs:  Results for orders placed or performed during the hospital encounter of 08/25/18 (from the past 24 hour(s))  Type and screen [facility]   Collection Time: 08/31/18  5:04 AM  Result Value Ref Range   ABO/RH(D) B POS    Antibody Screen NEG    Sample Expiration      09/03/2018 Performed at [facility], [address], [place], [place] [address]     Imaging Studies:      Medications:  Scheduled . amoxicillin  500 mg Oral Q8H  . azithromycin  500 mg Oral Daily  . docusate sodium  100 mg Oral Daily  . nystatin  5 mL Oral QID  . prenatal multivitamin  1 tablet Oral Q1200  . sodium chloride flush  3 mL Intravenous Q12H     ASSESSMENT: [id number] [redacted]w[redacted]d Estimated Date of Delivery: 10/13/18  Patient Active Problem List   Diagnosis Date Noted  . Preterm premature rupture of membranes (PPROM) with unknown onset of labor 08/25/2018  . H/O macrosomia in infant in prior pregnancy, currently pregnant 07/26/2018  . Obesity affecting pregnancy, antepartum 06/22/2018  . Advanced maternal age in multigravida 06/14/2018  . Supervision of other normal pregnancy, antepartum 05/03/2018  . Pregnancy with history of spontaneous abortion in first trimester 05/03/2018  . GBS bacteriuria 04/03/2018    PLAN: >begin IOL tonight 34 weeks >nystatin oral wash  Hartley Urton H Lamika Connolly 08/31/2018,7:48 AM

## 2018-08-31 NOTE — Progress Notes (Signed)
Vitals:   08/31/18 1613 08/31/18 1940  BP: (!) 113/57 (!) 111/52  Pulse: 86 87  Resp: 16 17  Temp: 98 F (36.7 C) 97.9 F (36.6 C)  SpO2: 99% 98%   Start IOL for PPROM, now 34 weeks.  Cx LTC.  Cytotec for now.

## 2018-09-01 ENCOUNTER — Encounter (HOSPITAL_COMMUNITY): Payer: Self-pay | Admitting: *Deleted

## 2018-09-01 ENCOUNTER — Inpatient Hospital Stay (HOSPITAL_COMMUNITY): Payer: BLUE CROSS/BLUE SHIELD

## 2018-09-01 DIAGNOSIS — O99824 Streptococcus B carrier state complicating childbirth: Secondary | ICD-10-CM

## 2018-09-01 DIAGNOSIS — O42113 Preterm premature rupture of membranes, onset of labor more than 24 hours following rupture, third trimester: Secondary | ICD-10-CM

## 2018-09-01 DIAGNOSIS — Z3A34 34 weeks gestation of pregnancy: Secondary | ICD-10-CM

## 2018-09-01 LAB — CBC
HEMATOCRIT: 36.9 % (ref 36.0–46.0)
Hemoglobin: 12 g/dL (ref 12.0–15.0)
MCH: 29.1 pg (ref 26.0–34.0)
MCHC: 32.5 g/dL (ref 30.0–36.0)
MCV: 89.3 fL (ref 80.0–100.0)
NRBC: 0 % (ref 0.0–0.2)
Platelets: 279 10*3/uL (ref 150–400)
RBC: 4.13 MIL/uL (ref 3.87–5.11)
RDW: 14.2 % (ref 11.5–15.5)
WBC: 13.8 10*3/uL — ABNORMAL HIGH (ref 4.0–10.5)

## 2018-09-01 MED ORDER — PRENATAL MULTIVITAMIN CH
1.0000 | ORAL_TABLET | Freq: Every day | ORAL | Status: DC
  Administered 2018-09-02 – 2018-09-03 (×2): 1 via ORAL
  Filled 2018-09-01 (×2): qty 1

## 2018-09-01 MED ORDER — FENTANYL-BUPIVACAINE-NACL 0.5-0.125-0.9 MG/250ML-% EP SOLN: 12.0000 mL/h | EPIDURAL | Status: DC | PRN

## 2018-09-01 MED ORDER — SENNOSIDES-DOCUSATE SODIUM 8.6-50 MG PO TABS
2.0000 | ORAL_TABLET | ORAL | Status: DC
  Administered 2018-09-01 – 2018-09-02 (×2): 2 via ORAL
  Filled 2018-09-01 (×2): qty 2

## 2018-09-01 MED ORDER — OXYTOCIN 40 UNITS IN NORMAL SALINE INFUSION - SIMPLE MED: 2.5000 [IU]/h | INTRAVENOUS | Status: DC

## 2018-09-01 MED ORDER — PHENYLEPHRINE 40 MCG/ML (10ML) SYRINGE FOR IV PUSH (FOR BLOOD PRESSURE SUPPORT): 80.0000 ug | PREFILLED_SYRINGE | INTRAVENOUS | Status: DC | PRN

## 2018-09-01 MED ORDER — ACETAMINOPHEN 325 MG PO TABS
650.0000 mg | ORAL_TABLET | ORAL | Status: DC | PRN
  Administered 2018-09-01: 650 mg via ORAL
  Filled 2018-09-01: qty 2

## 2018-09-01 MED ORDER — OXYCODONE-ACETAMINOPHEN 5-325 MG PO TABS: 2.0000 | ORAL_TABLET | ORAL | Status: DC | PRN

## 2018-09-01 MED ORDER — BENZOCAINE-MENTHOL 20-0.5 % EX AERO
1.0000 "application " | INHALATION_SPRAY | CUTANEOUS | Status: DC | PRN
  Administered 2018-09-01 – 2018-09-02 (×2): 1 via TOPICAL
  Filled 2018-09-01 (×2): qty 56

## 2018-09-01 MED ORDER — OXYCODONE-ACETAMINOPHEN 5-325 MG PO TABS: 1.0000 | ORAL_TABLET | ORAL | Status: DC | PRN

## 2018-09-01 MED ORDER — ONDANSETRON HCL 4 MG/2ML IJ SOLN: 4.0000 mg | INTRAMUSCULAR | Status: DC | PRN

## 2018-09-01 MED ORDER — IBUPROFEN 600 MG PO TABS
600.0000 mg | ORAL_TABLET | Freq: Four times a day (QID) | ORAL | Status: DC
  Administered 2018-09-01 – 2018-09-03 (×8): 600 mg via ORAL
  Filled 2018-09-01 (×8): qty 1

## 2018-09-01 MED ORDER — PENICILLIN G 3 MILLION UNITS IVPB - SIMPLE MED
3.0000 10*6.[IU] | INTRAVENOUS | Status: DC
  Administered 2018-09-01 (×3): 3 10*6.[IU] via INTRAVENOUS
  Filled 2018-09-01 (×3): qty 100

## 2018-09-01 MED ORDER — ONDANSETRON HCL 4 MG/2ML IJ SOLN: 4.0000 mg | Freq: Four times a day (QID) | INTRAMUSCULAR | Status: DC | PRN

## 2018-09-01 MED ORDER — TERBUTALINE SULFATE 1 MG/ML IJ SOLN: 0.2500 mg | Freq: Once | INTRAMUSCULAR | Status: DC | PRN

## 2018-09-01 MED ORDER — DIBUCAINE 1 % RE OINT: 1.0000 "application " | TOPICAL_OINTMENT | RECTAL | Status: DC | PRN

## 2018-09-01 MED ORDER — ONDANSETRON HCL 4 MG PO TABS: 4.0000 mg | ORAL_TABLET | ORAL | Status: DC | PRN

## 2018-09-01 MED ORDER — ZOLPIDEM TARTRATE 5 MG PO TABS: 5.0000 mg | ORAL_TABLET | Freq: Every evening | ORAL | Status: DC | PRN

## 2018-09-01 MED ORDER — LACTATED RINGERS IV SOLN
INTRAVENOUS | Status: DC
  Administered 2018-09-01 (×2): via INTRAVENOUS

## 2018-09-01 MED ORDER — COCONUT OIL OIL: 1.0000 "application " | TOPICAL_OIL | Status: DC | PRN

## 2018-09-01 MED ORDER — FLEET ENEMA 7-19 GM/118ML RE ENEM: 1.0000 | ENEMA | RECTAL | Status: DC | PRN

## 2018-09-01 MED ORDER — DIPHENHYDRAMINE HCL 50 MG/ML IJ SOLN: 12.5000 mg | INTRAMUSCULAR | Status: DC | PRN

## 2018-09-01 MED ORDER — OXYTOCIN BOLUS FROM INFUSION
500.0000 mL | Freq: Once | INTRAVENOUS | Status: AC
  Administered 2018-09-01: 500 mL via INTRAVENOUS

## 2018-09-01 MED ORDER — TETANUS-DIPHTH-ACELL PERTUSSIS 5-2.5-18.5 LF-MCG/0.5 IM SUSP: 0.5000 mL | Freq: Once | INTRAMUSCULAR | Status: DC

## 2018-09-01 MED ORDER — DIPHENHYDRAMINE HCL 25 MG PO CAPS: 25.0000 mg | ORAL_CAPSULE | Freq: Four times a day (QID) | ORAL | Status: DC | PRN

## 2018-09-01 MED ORDER — WITCH HAZEL-GLYCERIN EX PADS: 1.0000 "application " | MEDICATED_PAD | CUTANEOUS | Status: DC | PRN

## 2018-09-01 MED ORDER — SODIUM CHLORIDE 0.9 % IV SOLN
5.0000 10*6.[IU] | Freq: Once | INTRAVENOUS | Status: AC
  Administered 2018-09-01: 5 10*6.[IU] via INTRAVENOUS
  Filled 2018-09-01: qty 5

## 2018-09-01 MED ORDER — SIMETHICONE 80 MG PO CHEW: 80.0000 mg | CHEWABLE_TABLET | ORAL | Status: DC | PRN

## 2018-09-01 MED ORDER — SOD CITRATE-CITRIC ACID 500-334 MG/5ML PO SOLN: 30.0000 mL | ORAL | Status: DC | PRN

## 2018-09-01 MED ORDER — LIDOCAINE HCL (PF) 1 % IJ SOLN
30.0000 mL | INTRAMUSCULAR | Status: AC | PRN
  Administered 2018-09-01: 30 mL via SUBCUTANEOUS
  Filled 2018-09-01: qty 30

## 2018-09-01 MED ORDER — LACTATED RINGERS IV SOLN: 500.0000 mL | INTRAVENOUS | Status: DC | PRN

## 2018-09-01 MED ORDER — EPHEDRINE 5 MG/ML INJ: 10.0000 mg | INTRAVENOUS | Status: DC | PRN

## 2018-09-01 MED ORDER — FENTANYL CITRATE (PF) 100 MCG/2ML IJ SOLN: 50.0000 ug | INTRAMUSCULAR | Status: DC | PRN

## 2018-09-01 MED ORDER — MISOPROSTOL 50MCG HALF TABLET
50.0000 ug | ORAL_TABLET | ORAL | Status: DC | PRN
  Administered 2018-09-01 (×3): 50 ug via ORAL
  Filled 2018-09-01 (×3): qty 1

## 2018-09-01 MED ORDER — HYDROXYZINE HCL 50 MG PO TABS: 50.0000 mg | ORAL_TABLET | Freq: Four times a day (QID) | ORAL | Status: DC | PRN

## 2018-09-01 MED ORDER — LACTATED RINGERS IV SOLN: 500.0000 mL | Freq: Once | INTRAVENOUS | Status: DC

## 2018-09-01 MED ORDER — ACETAMINOPHEN 325 MG PO TABS: 650.0000 mg | ORAL_TABLET | ORAL | Status: DC | PRN

## 2018-09-01 MED ORDER — OXYTOCIN 40 UNITS IN NORMAL SALINE INFUSION - SIMPLE MED
1.0000 m[IU]/min | INTRAVENOUS | Status: DC
  Administered 2018-09-01: 2 m[IU]/min via INTRAVENOUS
  Filled 2018-09-01: qty 1000

## 2018-09-01 NOTE — Progress Notes (Signed)
Labor Progress Note Courtney Woodward is a 38 y.o. [id number] at [redacted]w[redacted]d presented for IOL for PPROM  S:  Feeling some ctx  O:  BP 106/72   Pulse 68   Temp 98.1 F (36.7 C) (Oral)   Resp 18   Ht 5\' 7"  (1.702 m)   Wt 131.3 kg   LMP 01/06/2018 (Exact Date)   SpO2 98%   BMI 45.34 kg/m  EFM: baseline 135 bpm/ mod variability/ no accels/ no decels  Toco: 3-5 SVE: Dilation: 1 Effacement (%): 90 Cervical Position: Posterior Station: -2 Presentation: Vertex Exam by:: Trinitey Roache, CNM  A/P: 38 y.o. G3P1102 [redacted]w[redacted]d  1. Labor: latent 2. FWB: Cat I 3. Pain: analgesia/anesthesia prn  Consented for FB placement. Tolerated well. Start Pitocin once FB out. Anticipate labor progress and SVD.  Donette Larry, CNM 12:19 PM

## 2018-09-01 NOTE — Progress Notes (Signed)
Labor Progress Note Courtney Woodward is a 39 y.o. [id number] at [redacted]w[redacted]d presented for IOL for PPROM  S:  Feeling some ctx and cramping. Declines pain meds.  O:  BP 116/63   Pulse 68   Temp 98.2 F (36.8 C) (Oral)   Resp 18   Ht 5\' 7"  (1.702 m)   Wt 131.3 kg   LMP 01/06/2018 (Exact Date)   SpO2 98%   BMI 45.34 kg/m  EFM: baseline 130 bpm/ mod variability/ + accels/ no decels  Toco: 3-5 SVE: deferred  A/P: 38 y.o. G3P1102 [redacted]w[redacted]d  1. Labor: latent 2. FWB: Cat I 3. Pain: analgesia/anesthesia prn  Continue Cytotec. Place FB when able. Anticipate labor progression and SVD.  Donette Larry, CNM 9:44 AM

## 2018-09-01 NOTE — Plan of Care (Signed)
HSV culture collected in wrong tube. K. Clelia Croft, CNM req to recollect states ok to cancel test.

## 2018-09-01 NOTE — Plan of Care (Signed)
  Problem: Education: Goal: Knowledge of Childbirth will improve Outcome: Progressing   Problem: Coping: Goal: Ability to verbalize concerns and feelings about labor and delivery will improve Outcome: Progressing   

## 2018-09-01 NOTE — Discharge Summary (Signed)
Obstetrics Discharge Summary OB/GYN Faculty Practice   Patient Name: Courtney Woodward DOB: [birth year] MRN: [id number]  Date of admission: 08/25/2018 Delivering MD: Marlin Canary   Date of discharge: 09/03/2018  Admitting diagnosis: 33 WKS, LEAKING FLUIDS Intrauterine pregnancy: [redacted]w[redacted]d     Secondary diagnosis:   Active Problems:   Preterm premature rupture of membranes (PPROM) with unknown onset of labor   SVD (spontaneous vaginal delivery)  Additional problems:  . AMA . Obesity     Discharge diagnosis: Preterm Pregnancy Delivered                                            Postpartum procedures: None  Complications: None  Hospital course: Falan Blade is a 38 y.o. [redacted]w[redacted]d who was admitted for PPROM at [redacted]w[redacted]d. She completed a course of betamethasone and also given latency antibiotics. Her pregnancy was complicated by PPROM, obesity and AMA. On 3/27 she was induced at [redacted]w[redacted]d. Her labor course was notable for augmentation with cytotec and pitocin. Delivery was complicated by 2nd degree laceration which was repaired in usual fashion. Baby was taken to the NICU. Also noted at the time of delivery were two lesions on superior L labia, concern for ruptured vesicles. Patient though she cut her skin shaving and denied any history of HSV lesions. Collected viral swab of the lesions, results pending at discharge. Please see delivery/op note for additional details. Her postpartum course was uncomplicated. By day of discharge, she was passing flatus, urinating, eating and drinking without difficulty. Her pain was well-controlled, and she was discharged home with ibuprofen. She will follow-up in clinic in 4-6 weeks.   Physical exam  Vitals:   09/02/18 0523 09/02/18 0900 09/02/18 2150 09/03/18 0500  BP: (!) 106/56 122/68 129/65 118/72  Pulse: 64 74 75 67  Resp: 16 18 18 18   Temp: 98.4 F (36.9 C) 98.5 F (36.9 C) 98.2 F (36.8 C) 98 F (36.7 C)  TempSrc: Oral Oral Oral Oral  SpO2:  100% 98%    Weight:      Height:       General: alert, oriented, NAD Lochia: appropriate Uterine Fundus: firm Incision: N/A DVT Evaluation: No evidence of DVT seen on physical exam. Labs: Lab Results  Component Value Date   WBC 13.8 (H) 09/01/2018   HGB 12.0 09/01/2018   HCT 36.9 09/01/2018   MCV 89.3 09/01/2018   PLT 279 09/01/2018   CMP Latest Ref Rng & Units 01/05/2018  Glucose 70 - 99 mg/dL 95  BUN 6 - 20 mg/dL 25(W)  Creatinine 3.89 - 1.00 mg/dL 3.73  Sodium 428 - 768 mmol/L 142  Potassium 3.5 - 5.1 mmol/L 3.9  Chloride 98 - 111 mmol/L 109  CO2 22 - 32 mmol/L 24  Calcium 8.9 - 10.3 mg/dL 9.0    Discharge instructions: Per After Visit Summary and "Baby and Me Booklet"  After visit meds:  Allergies as of 09/03/2018   No Known Allergies     Medication List    TAKE these medications   acetaminophen 325 MG tablet Commonly known as:  TYLENOL Take 325 mg by mouth every 6 (six) hours as needed for moderate pain.   calcium carbonate 500 MG chewable tablet Commonly known as:  TUMS - dosed in mg elemental calcium Chew 2 tablets by mouth 3 (three) times daily as needed for indigestion or heartburn.   fluticasone 0.05 %  cream Commonly known as:  CUTIVATE Apply 1 application topically 2 (two) times daily.   ibuprofen 600 MG tablet Commonly known as:  ADVIL,MOTRIN Take 1 tablet (600 mg total) by mouth every 6 (six) hours.   prenatal multivitamin Tabs tablet Take 1 tablet by mouth daily at 12 noon.       Postpartum contraception: Combination OCPs Diet: Routine Diet Activity: Advance as tolerated. Pelvic rest for 6 weeks.   Outpatient follow up:4 weeks Follow-up Appt:No future appointments. Follow-up Visit:No follow-ups on file.  Newborn Data: Live born female  Birth Weight: 5 lb 8.9 oz (2520 g) APGAR: 8, 9  Newborn Delivery   Birth date/time:  09/01/2018 16:01:00 Delivery type:  Vaginal, Spontaneous     Baby Feeding: Bottle Disposition:NICU  Gwenevere Abbot,  MD 09/03/2018 9:20 AM

## 2018-09-01 NOTE — Progress Notes (Signed)
OB/GYN Faculty Practice: Labor Progress Note  Subjective: Contractions very painful.   Objective: BP 133/68   Pulse 73   Temp 98.1 F (36.7 C) (Oral)   Resp 18   Ht 5\' 7"  (1.702 m)   Wt 131.3 kg   LMP 01/06/2018 (Exact Date)   SpO2 98%   BMI 45.34 kg/m  Gen: moderate distress Dilation: (P) 6.5 Effacement (%): (P) 90 Cervical Position: Posterior Station: (P) -1 Presentation: Vertex Exam by:: (P) J.Cox, RN  Assessment and Plan: 38 y.o. P6P9509 [redacted]w[redacted]d here for IOL for PPROM.   Labor: continue pitocin -- pain control: desires natural  Fetal Well-Being: Cephalic by prior exam. S/p 2 doses betamethasone (3/20, 3/21) -- difficult to trace but Category 2 - continuous fetal monitoring  -- GBS + on pencillin   Burman Nieves, MD Family Medicine Resident 3:45 PM

## 2018-09-02 NOTE — Progress Notes (Signed)
Post Partum Day 1 Subjective: no complaints, up ad lib, voiding, tolerating PO and + flatus  Objective: Blood pressure (!) 106/56, pulse 64, temperature 98.4 F (36.9 C), temperature source Oral, resp. rate 16, height 5\' 7"  (1.702 m), weight 131.3 kg, last menstrual period 01/06/2018, SpO2 99 %, unknown if currently breastfeeding.  Physical Exam:  General: alert, cooperative and no distress Lochia: appropriate Uterine Fundus: firm DVT Evaluation: trace LE edema, no calf tenderness or cords palpable  Recent Labs    09/01/18 0027  HGB 12.0  HCT 36.9    Assessment/Plan: Plan for discharge tomorrow and Contraception - OCPs  Baby in NICU - formula feeds  - continue routine postpartum care   LOS: 8 days   Burman Nieves, MD Family Medicine Resident  09/02/2018, 7:52 AM

## 2018-09-03 MED ORDER — IBUPROFEN 600 MG PO TABS: 600.0000 mg | ORAL_TABLET | Freq: Four times a day (QID) | ORAL | 0 refills | Status: DC

## 2018-09-03 NOTE — Lactation Note (Signed)
This note was copied from a baby's chart. Lactation Consultation Note  Patient Name: Courtney Woodward Today's Date: 09/03/2018  Lactation visit attempted; Mom's body language and answers to my questions implied that she had no interest in providing breast milk to her infant (even after I explained that pumping at this point was more for stimulation and not necessarily for obtaining volume, thus, it was ok if she wasn't "getting anything").   When I asked Mom how she planned to feed her infant in a few weeks, her answer was, "Formula."   Lurline Hare [facility] 09/03/2018, 12:22 PM

## 2018-09-04 LAB — HSV CULTURE AND TYPING

## 2018-09-07 ENCOUNTER — Ambulatory Visit (HOSPITAL_COMMUNITY): Payer: Medicaid Other

## 2018-09-09 ENCOUNTER — Encounter (HOSPITAL_COMMUNITY): Payer: Self-pay | Admitting: *Deleted

## 2018-09-09 ENCOUNTER — Other Ambulatory Visit: Payer: Self-pay

## 2018-09-09 ENCOUNTER — Inpatient Hospital Stay (HOSPITAL_COMMUNITY)
Admission: AD | Admit: 2018-09-09 | Discharge: 2018-09-09 | Disposition: A | Discharge status: Home / Self Care | Payer: Medicaid Other | Source: Ambulatory Visit | Attending: Obstetrics and Gynecology | Admitting: Obstetrics and Gynecology

## 2018-09-09 DIAGNOSIS — Z833 Family history of diabetes mellitus: Secondary | ICD-10-CM | POA: Diagnosis not present

## 2018-09-09 DIAGNOSIS — Z90721 Acquired absence of ovaries, unilateral: Secondary | ICD-10-CM | POA: Insufficient documentation

## 2018-09-09 DIAGNOSIS — G8918 Other acute postprocedural pain: Secondary | ICD-10-CM | POA: Diagnosis present

## 2018-09-09 DIAGNOSIS — Z87891 Personal history of nicotine dependence: Secondary | ICD-10-CM | POA: Insufficient documentation

## 2018-09-09 DIAGNOSIS — Z809 Family history of malignant neoplasm, unspecified: Secondary | ICD-10-CM | POA: Insufficient documentation

## 2018-09-09 DIAGNOSIS — F419 Anxiety disorder, unspecified: Secondary | ICD-10-CM | POA: Diagnosis not present

## 2018-09-09 DIAGNOSIS — O99343 Other mental disorders complicating pregnancy, third trimester: Secondary | ICD-10-CM | POA: Insufficient documentation

## 2018-09-09 DIAGNOSIS — R109 Unspecified abdominal pain: Secondary | ICD-10-CM | POA: Insufficient documentation

## 2018-09-09 DIAGNOSIS — O9089 Other complications of the puerperium, not elsewhere classified: Secondary | ICD-10-CM

## 2018-09-09 DIAGNOSIS — R52 Pain, unspecified: Secondary | ICD-10-CM

## 2018-09-09 DIAGNOSIS — O9989 Other specified diseases and conditions complicating pregnancy, childbirth and the puerperium: Secondary | ICD-10-CM | POA: Insufficient documentation

## 2018-09-09 LAB — CBC
HCT: 36.5 % (ref 36.0–46.0)
Hemoglobin: 12.2 g/dL (ref 12.0–15.0)
MCH: 29.8 pg (ref 26.0–34.0)
MCHC: 33.4 g/dL (ref 30.0–36.0)
MCV: 89.2 fL (ref 80.0–100.0)
Platelets: 327 K/uL (ref 150–400)
RBC: 4.09 MIL/uL (ref 3.87–5.11)
RDW: 13.6 % (ref 11.5–15.5)
WBC: 9.1 K/uL (ref 4.0–10.5)
nRBC: 0 % (ref 0.0–0.2)

## 2018-09-09 MED ORDER — KETOROLAC TROMETHAMINE 60 MG/2ML IM SOLN
60.0000 mg | Freq: Once | INTRAMUSCULAR | Status: AC
  Administered 2018-09-09: 60 mg via INTRAMUSCULAR
  Filled 2018-09-09: qty 2

## 2018-09-09 MED ORDER — IBUPROFEN 600 MG PO TABS: 600.0000 mg | ORAL_TABLET | Freq: Four times a day (QID) | ORAL | 0 refills | Status: DC

## 2018-09-09 NOTE — MAU Provider Note (Signed)
History     CSN: [id number]  Arrival date and time: 09/09/18 1138   Chief Complaint  Patient presents with  . Abdominal Pain   HPI Courtney Woodward is a 38 y.o. [id number] 8 days postpartum from a vaginal delivery who presents with abdominal soreness. She states she has been back and forth to the hospital to visit her baby in NICU and feels like her abdomen is sore. She has not taken anything for pain since she was discharged from the hospital. She also still reports some vaginal bleeding.  She had an uncomplicated vaginal delivery at 33 weeks for PPROM.  OB History    Gravida  3   Para  3   Term  1   Preterm  2   AB  0   Living  3     SAB  0   TAB  0   Ectopic  0   Multiple  0   Live Births  3           Past Medical History:  Diagnosis Date  . 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 Anxiety   . Hx of trichomoniasis 12/2017  . PIH (pregnancy induced hypertension) 2004    Past Surgical History:  Procedure Laterality Date  . LEEP  11/2017  . OOPHORECTOMY  2004   Rt. ovary removed--Hemorrhagic Corpus Luteum Cyst    Family History  Problem Relation Age of Onset  . Stroke Mother        Hx of 3 strokes  . Cancer Mother        Dec unknown Cancer age 23  . Diabetes Mother   . Hyperlipidemia Mother     Social History   Tobacco Use  . Smoking status: Former Smoker    Last attempt to quit: 06/07/2001    Years since quitting: 17.2  . Smokeless tobacco: Never Used  Substance Use Topics  . Alcohol use: Not Currently    Alcohol/week: 0.0 standard drinks    Comment: only occ.  . Drug use: No    Allergies: No Known Allergies  Medications Prior to Admission  Medication Sig Dispense Refill Last Dose  . acetaminophen (TYLENOL) 325 MG tablet Take 325 mg by mouth every 6 (six) hours as needed for moderate pain.   09/08/2018 at Unknown time  . ibuprofen (ADVIL,MOTRIN) 600 MG tablet Take 1 tablet (600 mg total) by mouth every 6 (six) hours. 30  tablet 0 Past Week at Unknown time  . calcium carbonate (TUMS - DOSED IN MG ELEMENTAL CALCIUM) 500 MG chewable tablet Chew 2 tablets by mouth 3 (three) times daily as needed for indigestion or heartburn.   Unknown at Unknown time  . fluticasone (CUTIVATE) 0.05 % cream Apply 1 application topically 2 (two) times daily.   Unknown at Unknown time  . Prenatal Vit-Fe Fumarate-FA (PRENATAL MULTIVITAMIN) TABS tablet Take 1 tablet by mouth daily at 12 noon.   Unknown at Unknown time    Review of Systems  Constitutional: Negative.  Negative for fatigue and fever.  HENT: Negative.   Respiratory: Negative.  Negative for shortness of breath.   Cardiovascular: Negative.  Negative for chest pain.  Gastrointestinal: Positive for abdominal pain. Negative for constipation, diarrhea, nausea and vomiting.  Genitourinary: Positive for vaginal bleeding. Negative for dysuria.  Neurological: Negative.  Negative for dizziness and headaches.   Physical Exam   Blood pressure 125/77, pulse 81, temperature 98.7 F (37.1 C), temperature source Oral, resp. rate  18, height 5\' 7"  (1.702 m), weight 125.7 kg, last menstrual period 01/06/2018, SpO2 97 %, unknown if currently breastfeeding.  Physical Exam  Nursing note and vitals reviewed. Constitutional: She is oriented to person, place, and time. She appears well-developed and well-nourished. No distress.  HENT:  Head: Normocephalic.  Eyes: Pupils are equal, round, and reactive to light.  Cardiovascular: Normal rate, regular rhythm and normal heart sounds.  Respiratory: Effort normal and breath sounds normal. No respiratory distress.  GI: Soft. Bowel sounds are normal. She exhibits no distension and no mass. There is no abdominal tenderness. There is no rebound and no guarding.  Genitourinary:    Genitourinary Comments: Scant brown/light red bleeding   Neurological: She is alert and oriented to person, place, and time.  Skin: Skin is warm and dry.  Psychiatric: She  has a normal mood and affect. Her behavior is normal. Judgment and thought content normal.    MAU Course  Procedures Results for orders placed or performed during the hospital encounter of 09/09/18 (from the past 24 hour(s))  CBC     Status: None   Collection Time: 09/09/18 12:45 PM  Result Value Ref Range   WBC 9.1 4.0 - 10.5 K/uL   RBC 4.09 3.87 - 5.11 MIL/uL   Hemoglobin 12.2 12.0 - 15.0 g/dL   HCT 88.8 28.0 - 03.4 %   MCV 89.2 80.0 - 100.0 fL   MCH 29.8 26.0 - 34.0 pg   MCHC 33.4 30.0 - 36.0 g/dL   RDW 91.7 91.5 - 05.6 %   Platelets 327 150 - 400 K/uL   nRBC 0.0 0.0 - 0.2 %   MDM CBC Toradol IM  Patient reports relief from pain.  Low suspicion for any acute processes like endometritis due to lack of fever, tenderness, and lack of leukocytosis.  Assessment and Plan   1. Postpartum pain    -Discharge home in stable condition -Rx for ibuprofen sent to patient's pharmacy -Pain and infection precautions discussed. Encouraged patient to take ibuprofen as needed -Patient advised to follow-up with [facility] as scheduled for postpartum care -Patient may return to MAU as needed or if her condition were to change or worsen   Rolm Bookbinder CNM 09/09/2018, 12:06 PM

## 2018-09-09 NOTE — MAU Note (Signed)
Courtney Woodward is a 37 y.o. here in MAU reporting: SVD on 03/27, states she started having lower abdominal soreness since Monday. Has taken tylenol and it didn't help. Reports her bleeding is still a little bit heavy, pt reports she is changing a yellow pad every 2 hours. States the pad is just dirty enough to change it, not saturated.  Onset of complaint: since Monday  Pain score: 7/10  Vitals:   09/09/18 1154  BP: 125/77  Pulse: 81  Resp: 18  Temp: 98.7 F (37.1 C)  SpO2: 97%      Lab orders placed from triage: none

## 2018-09-14 ENCOUNTER — Ambulatory Visit (HOSPITAL_COMMUNITY): Payer: Medicaid Other

## 2018-09-20 ENCOUNTER — Encounter: Payer: Medicaid Other | Admitting: Obstetrics and Gynecology

## 2018-09-28 ENCOUNTER — Other Ambulatory Visit: Payer: Self-pay

## 2018-09-28 ENCOUNTER — Ambulatory Visit (INDEPENDENT_AMBULATORY_CARE_PROVIDER_SITE_OTHER): Payer: Medicaid Other | Admitting: Obstetrics and Gynecology

## 2018-09-28 ENCOUNTER — Encounter: Payer: Self-pay | Admitting: Obstetrics and Gynecology

## 2018-09-28 ENCOUNTER — Other Ambulatory Visit (HOSPITAL_COMMUNITY)
Admission: RE | Admit: 2018-09-28 | Discharge: 2018-09-28 | Disposition: A | Discharge status: Home / Self Care | Payer: Medicaid Other | Source: Ambulatory Visit | Attending: Obstetrics and Gynecology | Admitting: Obstetrics and Gynecology

## 2018-09-28 DIAGNOSIS — Z3202 Encounter for pregnancy test, result negative: Secondary | ICD-10-CM | POA: Diagnosis not present

## 2018-09-28 DIAGNOSIS — Z30013 Encounter for initial prescription of injectable contraceptive: Secondary | ICD-10-CM | POA: Diagnosis not present

## 2018-09-28 DIAGNOSIS — Z8742 Personal history of other diseases of the female genital tract: Secondary | ICD-10-CM | POA: Insufficient documentation

## 2018-09-28 DIAGNOSIS — Z309 Encounter for contraceptive management, unspecified: Secondary | ICD-10-CM | POA: Insufficient documentation

## 2018-09-28 LAB — POCT URINE PREGNANCY: Preg Test, Ur: NEGATIVE

## 2018-09-28 MED ORDER — MEDROXYPROGESTERONE ACETATE 150 MG/ML IM SUSP: 150.0000 mg | INTRAMUSCULAR | 3 refills | Status: AC

## 2018-09-28 MED ORDER — MEDROXYPROGESTERONE ACETATE 150 MG/ML IM SUSP
150.0000 mg | Freq: Once | INTRAMUSCULAR | Status: AC
  Administered 2018-09-28: 14:00:00 150 mg via INTRAMUSCULAR

## 2018-09-28 NOTE — Patient Instructions (Addendum)
Health Maintenance, Female Adopting a healthy lifestyle and getting preventive care can go a long way to promote health and wellness. Talk with your health care provider about what schedule of regular examinations is right for you. This is a good chance for you to check in with your provider about disease prevention and staying healthy. In between checkups, there are plenty of things you can do on your own. Experts have done a lot of research about which lifestyle changes and preventive measures are most likely to keep you healthy. Ask your health care provider for more information. Weight and diet Eat a healthy diet  Be sure to include plenty of vegetables, fruits, low-fat dairy products, and lean protein.  Do not eat a lot of foods high in solid fats, added sugars, or salt.  Get regular exercise. This is one of the most important things you can do for your health. ? Most adults should exercise for at least 150 minutes each week. The exercise should increase your heart rate and make you sweat (moderate-intensity exercise). ? Most adults should also do strengthening exercises at least twice a week. This is in addition to the moderate-intensity exercise. Maintain a healthy weight  Body mass index (BMI) is a measurement that can be used to identify possible weight problems. It estimates body fat based on height and weight. Your health care provider can help determine your BMI and help you achieve or maintain a healthy weight.  For females 20 years of age and older: ? A BMI below 18.5 is considered underweight. ? A BMI of 18.5 to 24.9 is normal. ? A BMI of 25 to 29.9 is considered overweight. ? A BMI of 30 and above is considered obese. Watch levels of cholesterol and blood lipids  You should start having your blood tested for lipids and cholesterol at 39 years of age, then have this test every 5 years.  You may need to have your cholesterol levels checked more often if: ? Your lipid or  cholesterol levels are high. ? You are older than 39 years of age. ? You are at high risk for heart disease. Cancer screening Lung Cancer  Lung cancer screening is recommended for adults 55-80 years old who are at high risk for lung cancer because of a history of smoking.  A yearly low-dose CT scan of the lungs is recommended for people who: ? Currently smoke. ? Have quit within the past 15 years. ? Have at least a 30-pack-year history of smoking. A pack year is smoking an average of one pack of cigarettes a day for 1 year.  Yearly screening should continue until it has been 15 years since you quit.  Yearly screening should stop if you develop a health problem that would prevent you from having lung cancer treatment. Breast Cancer  Practice breast self-awareness. This means understanding how your breasts normally appear and feel.  It also means doing regular breast self-exams. Let your health care provider know about any changes, no matter how small.  If you are in your 20s or 30s, you should have a clinical breast exam (CBE) by a health care provider every 1-3 years as part of a regular health exam.  If you are 40 or older, have a CBE every year. Also consider having a breast X-ray (mammogram) every year.  If you have a family history of breast cancer, talk to your health care provider about genetic screening.  If you are at high risk for breast cancer, talk   to your health care provider about having an MRI and a mammogram every year.  Breast cancer gene (BRCA) assessment is recommended for women who have family members with BRCA-related cancers. BRCA-related cancers include: ? Breast. ? Ovarian. ? Tubal. ? Peritoneal cancers.  Results of the assessment will determine the need for genetic counseling and BRCA1 and BRCA2 testing. Cervical Cancer Your health care provider may recommend that you be screened regularly for cancer of the pelvic organs (ovaries, uterus, and vagina).  This screening involves a pelvic examination, including checking for microscopic changes to the surface of your cervix (Pap test). You may be encouraged to have this screening done every 3 years, beginning at age 47.  For women ages 38-65, health care providers may recommend pelvic exams and Pap testing every 3 years, or they may recommend the Pap and pelvic exam, combined with testing for human papilloma virus (HPV), every 5 years. Some types of HPV increase your risk of cervical cancer. Testing for HPV may also be done on women of any age with unclear Pap test results.  Other health care providers may not recommend any screening for nonpregnant women who are considered low risk for pelvic cancer and who do not have symptoms. Ask your health care provider if a screening pelvic exam is right for you.  If you have had past treatment for cervical cancer or a condition that could lead to cancer, you need Pap tests and screening for cancer for at least 20 years after your treatment. If Pap tests have been discontinued, your risk factors (such as having a new sexual partner) need to be reassessed to determine if screening should resume. Some women have medical problems that increase the chance of getting cervical cancer. In these cases, your health care provider may recommend more frequent screening and Pap tests. Colorectal Cancer  This type of cancer can be detected and often prevented.  Routine colorectal cancer screening usually begins at 39 years of age and continues through 39 years of age.  Your health care provider may recommend screening at an earlier age if you have risk factors for colon cancer.  Your health care provider may also recommend using home test kits to check for hidden blood in the stool.  A small camera at the end of a tube can be used to examine your colon directly (sigmoidoscopy or colonoscopy). This is done to check for the earliest forms of colorectal cancer.  Routine  screening usually begins at age 19.  Direct examination of the colon should be repeated every 5-10 years through 39 years of age. However, you may need to be screened more often if early forms of precancerous polyps or small growths are found. Skin Cancer  Check your skin from head to toe regularly.  Tell your health care provider about any new moles or changes in moles, especially if there is a change in a mole's shape or color.  Also tell your health care provider if you have a mole that is larger than the size of a pencil eraser.  Always use sunscreen. Apply sunscreen liberally and repeatedly throughout the day.  Protect yourself by wearing long sleeves, pants, a wide-brimmed hat, and sunglasses whenever you are outside. Heart disease, diabetes, and high blood pressure  High blood pressure causes heart disease and increases the risk of stroke. High blood pressure is more likely to develop in: ? People who have blood pressure in the high end of the normal range (130-139/85-89 mm Hg). ? People  who are overweight or obese. ? People who are African American.  If you are 84-22 years of age, have your blood pressure checked every 3-5 years. If you are 67 years of age or older, have your blood pressure checked every year. You should have your blood pressure measured twice-once when you are at a hospital or clinic, and once when you are not at a hospital or clinic. Record the average of the two measurements. To check your blood pressure when you are not at a hospital or clinic, you can use: ? An automated blood pressure machine at a pharmacy. ? A home blood pressure monitor.  If you are between 52 years and 3 years old, ask your health care provider if you should take aspirin to prevent strokes.  Have regular diabetes screenings. This involves taking a blood sample to check your fasting blood sugar level. ? If you are at a normal weight and have a low risk for diabetes, have this test once  every three years after 39 years of age. ? If you are overweight and have a high risk for diabetes, consider being tested at a younger age or more often. Preventing infection Hepatitis B  If you have a higher risk for hepatitis B, you should be screened for this virus. You are considered at high risk for hepatitis B if: ? You were born in a country where hepatitis B is common. Ask your health care provider which countries are considered high risk. ? Your parents were born in a high-risk country, and you have not been immunized against hepatitis B (hepatitis B vaccine). ? You have HIV or AIDS. ? You use needles to inject street drugs. ? You live with someone who has hepatitis B. ? You have had sex with someone who has hepatitis B. ? You get hemodialysis treatment. ? You take certain medicines for conditions, including cancer, organ transplantation, and autoimmune conditions. Hepatitis C  Blood testing is recommended for: ? Everyone born from [birth year] through 1966. ? Anyone with known risk factors for hepatitis C. Sexually transmitted infections (STIs)  You should be screened for sexually transmitted infections (STIs) including gonorrhea and chlamydia if: ? You are sexually active and are younger than 39 years of age. ? You are older than 39 years of age and your health care provider tells you that you are at risk for this type of infection. ? Your sexual activity has changed since you were last screened and you are at an increased risk for chlamydia or gonorrhea. Ask your health care provider if you are at risk.  If you do not have HIV, but are at risk, it may be recommended that you take a prescription medicine daily to prevent HIV infection. This is called pre-exposure prophylaxis (PrEP). You are considered at risk if: ? You are sexually active and do not regularly use condoms or know the HIV status of your partner(s). ? You take drugs by injection. ? You are sexually active with a partner  who has HIV. Talk with your health care provider about whether you are at high risk of being infected with HIV. If you choose to begin PrEP, you should first be tested for HIV. You should then be tested every 3 months for as long as you are taking PrEP. Pregnancy  If you are premenopausal and you may become pregnant, ask your health care provider about preconception counseling.  If you may become pregnant, take 400 to 800 micrograms (mcg) of folic acid every  day.  If you want to prevent pregnancy, talk to your health care provider about birth control (contraception). Osteoporosis and menopause  Osteoporosis is a disease in which the bones lose minerals and strength with aging. This can result in serious bone fractures. Your risk for osteoporosis can be identified using a bone density scan.  If you are 19 years of age or older, or if you are at risk for osteoporosis and fractures, ask your health care provider if you should be screened.  Ask your health care provider whether you should take a calcium or vitamin D supplement to lower your risk for osteoporosis.  Menopause may have certain physical symptoms and risks.  Hormone replacement therapy may reduce some of these symptoms and risks. Talk to your health care provider about whether hormone replacement therapy is right for you. Follow these instructions at home:  Schedule regular health, dental, and eye exams.  Stay current with your immunizations.  Do not use any tobacco products including cigarettes, chewing tobacco, or electronic cigarettes.  If you are pregnant, do not drink alcohol.  If you are breastfeeding, limit how much and how often you drink alcohol.  Limit alcohol intake to no more than 1 drink per day for nonpregnant women. One drink equals 12 ounces of beer, 5 ounces of wine, or 1 ounces of hard liquor.  Do not use street drugs.  Do not share needles.  Ask your health care provider for help if you need support  or information about quitting drugs.  Tell your health care provider if you often feel depressed.  Tell your health care provider if you have ever been abused or do not feel safe at home. This information is not intended to replace advice given to you by your health care provider. Make sure you discuss any questions you have with your health care provider. Document Released: 12/07/2010 Document Revised: 10/30/2015 Document Reviewed: 02/25/2015 Elsevier Interactive Patient Education  2019 [employer]. Medroxyprogesterone injection [Contraceptive] What is this medicine? MEDROXYPROGESTERONE (me DROX ee proe JES te rone) contraceptive injections prevent pregnancy. They provide effective birth control for 3 months. Depo-subQ Provera 104 is also used for treating pain related to endometriosis. This medicine may be used for other purposes; ask your health care provider or pharmacist if you have questions. COMMON BRAND NAME(S): Depo-Provera, Depo-subQ Provera 104 What should I tell my health care provider before I take this medicine? They need to know if you have any of these conditions: -frequently drink alcohol -asthma -blood vessel disease or a history of a blood clot in the lungs or legs -bone disease such as osteoporosis -breast cancer -diabetes -eating disorder (anorexia nervosa or bulimia) -high blood pressure -HIV infection or AIDS -kidney disease -liver disease -mental depression -migraine -seizures (convulsions) -stroke -tobacco smoker -vaginal bleeding -an unusual or allergic reaction to medroxyprogesterone, other hormones, medicines, foods, dyes, or preservatives -pregnant or trying to get pregnant -breast-feeding How should I use this medicine? Depo-Provera Contraceptive injection is given into a muscle. Depo-subQ Provera 104 injection is given under the skin. These injections are given by a health care professional. You must not be pregnant before getting an injection. The  injection is usually given during the first 5 days after the start of a menstrual period or 6 weeks after delivery of a baby. Talk to your pediatrician regarding the use of this medicine in children. Special care may be needed. These injections have been used in female children who have started having menstrual periods. Overdosage: If  you think you have taken too much of this medicine contact a poison control center or emergency room at once. NOTE: This medicine is only for you. Do not share this medicine with others. What if I miss a dose? Try not to miss a dose. You must get an injection once every 3 months to maintain birth control. If you cannot keep an appointment, call and reschedule it. If you wait longer than 13 weeks between Depo-Provera contraceptive injections or longer than 14 weeks between Depo-subQ Provera 104 injections, you could get pregnant. Use another method for birth control if you miss your appointment. You may also need a pregnancy test before receiving another injection. What may interact with this medicine? Do not take this medicine with any of the following medications: -bosentan This medicine may also interact with the following medications: -aminoglutethimide -antibiotics or medicines for infections, especially rifampin, rifabutin, rifapentine, and griseofulvin -aprepitant -barbiturate medicines such as phenobarbital or primidone -bexarotene -carbamazepine -medicines for seizures like ethotoin, felbamate, oxcarbazepine, phenytoin, topiramate -modafinil -St. John's wort This list may not describe all possible interactions. Give your health care provider a list of all the medicines, herbs, non-prescription drugs, or dietary supplements you use. Also tell them if you smoke, drink alcohol, or use illegal drugs. Some items may interact with your medicine. What should I watch for while using this medicine? This drug does not protect you against HIV infection (AIDS) or other  sexually transmitted diseases. Use of this product may cause you to lose calcium from your bones. Loss of calcium may cause weak bones (osteoporosis). Only use this product for more than 2 years if other forms of birth control are not right for you. The longer you use this product for birth control the more likely you will be at risk for weak bones. Ask your health care professional how you can keep strong bones. You may have a change in bleeding pattern or irregular periods. Many females stop having periods while taking this drug. If you have received your injections on time, your chance of being pregnant is very low. If you think you may be pregnant, see your health care professional as soon as possible. Tell your health care professional if you want to get pregnant within the next year. The effect of this medicine may last a long time after you get your last injection. What side effects may I notice from receiving this medicine? Side effects that you should report to your doctor or health care professional as soon as possible: -allergic reactions like skin rash, itching or hives, swelling of the face, lips, or tongue -breast tenderness or discharge -breathing problems -changes in vision -depression -feeling faint or lightheaded, falls -fever -pain in the abdomen, chest, groin, or leg -problems with balance, talking, walking -unusually weak or tired -yellowing of the eyes or skin Side effects that usually do not require medical attention (report to your doctor or health care professional if they continue or are bothersome): -acne -fluid retention and swelling -headache -irregular periods, spotting, or absent periods -temporary pain, itching, or skin reaction at site where injected -weight gain This list may not describe all possible side effects. Call your doctor for medical advice about side effects. You may report side effects to FDA at 1-800-FDA-1088. Where should I keep my  medicine? This does not apply. The injection will be given to you by a health care professional. NOTE: This sheet is a summary. It may not cover all possible information. If you have questions about  this medicine, talk to your doctor, pharmacist, or health care provider.  2019 Elsevier/Gold Standard (2008-06-14 18:37:56)

## 2018-09-28 NOTE — Progress Notes (Signed)
Post Partum Exam  Courtney Woodward is a 38 y.o. [phone] female who presents for a postpartum visit. She is 4 weeks postpartum following a spontaneous vaginal delivery. I have fully reviewed the prenatal and intrapartum course. The delivery was at 34 gestational weeks d/t PROM at 33 weeks.  Anesthesia: none. Postpartum course has been unremarkable. Baby's course has been unremarkable. Baby is feeding by bottle - Similac Neosure. Bleeding staining only. Bowel function is normal. Bladder function is normal. Patient is not sexually active. Contraception method is Depo-Provera injections. Postpartum depression screening:neg (score 0)  The following portions of the patient's history were reviewed and updated as appropriate: allergies, current medications, past family history, past medical history, past social history, past surgical history and problem list. Last pap smear done 01/2017 normal with pos HPV 16 then LEEP 11/2017.   Review of Systems Pertinent items noted in HPI and remainder of comprehensive ROS otherwise negative.    Objective:  Last menstrual period 01/06/2018, unknown if currently breastfeeding.  General:  alert   Breasts:  not examined  Lungs: clear to auscultation bilaterally  Heart:  regular rate and rhythm, S1, S2 normal, no murmur, click, rub or gallop  Abdomen: soft, non-tender; bowel sounds normal; no masses,  no organomegaly   Vulva:  normal  Vagina: normal vagina  Cervix:  no lesions  Corpus: normal  Adnexa:  normal adnexa  Rectal Exam: Not performed.        Assessment:    Nl postpartum exam. Pap smear done at today's visit.   Plan:   1. Contraception: Depo-Provera injections 2. Return to nl ADL's 3. Follow up q 12 weeks for Depo Provera o/w in 1 yr or PRN

## 2018-09-28 NOTE — Addendum Note (Signed)
Addended byFrutoso Chase on: 09/28/2018 02:27 PM   Modules accepted: Orders

## 2018-09-29 ENCOUNTER — Ambulatory Visit: Payer: Medicaid Other | Admitting: Obstetrics and Gynecology

## 2018-10-03 LAB — CYTOLOGY - PAP
Diagnosis: NEGATIVE
HPV: DETECTED — AB

## 2018-10-20 ENCOUNTER — Telehealth: Payer: Self-pay | Admitting: *Deleted

## 2018-10-20 NOTE — Telephone Encounter (Signed)
Pt called to office about paperwork. Placed call to pt. She states she needs FMLA paperwork completed.  Pt made aware we do not have any FMLA forms for her, she didn't know to bring them. Pt advised to contact employer for paperwork and send to office for completion.

## 2018-10-23 ENCOUNTER — Encounter: Payer: Self-pay | Admitting: *Deleted

## 2018-12-21 ENCOUNTER — Ambulatory Visit: Payer: Medicaid Other

## 2018-12-28 ENCOUNTER — Telehealth: Payer: Self-pay | Admitting: Obstetrics

## 2019-06-20 ENCOUNTER — Ambulatory Visit: Payer: Medicaid Other | Attending: Internal Medicine

## 2019-06-20 DIAGNOSIS — Z20822 Contact with and (suspected) exposure to covid-19: Secondary | ICD-10-CM

## 2019-06-21 LAB — NOVEL CORONAVIRUS, NAA: SARS-CoV-2, NAA: NOT DETECTED

## 2019-09-07 IMAGING — US US MFM OB FOLLOW UP
1 series · 13 of 24 positions shown · non-contrast
Comparison: none

[Series 1: us mfm ob follow up · 13 of 24 slices shown]
[im 1/24]
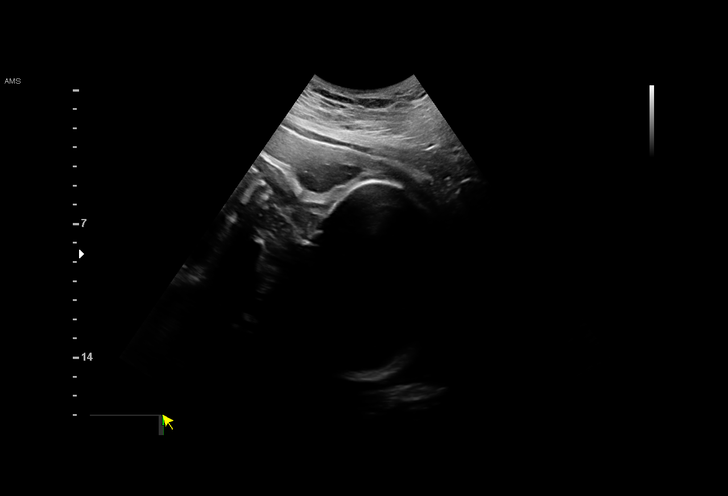
[im 3/24]
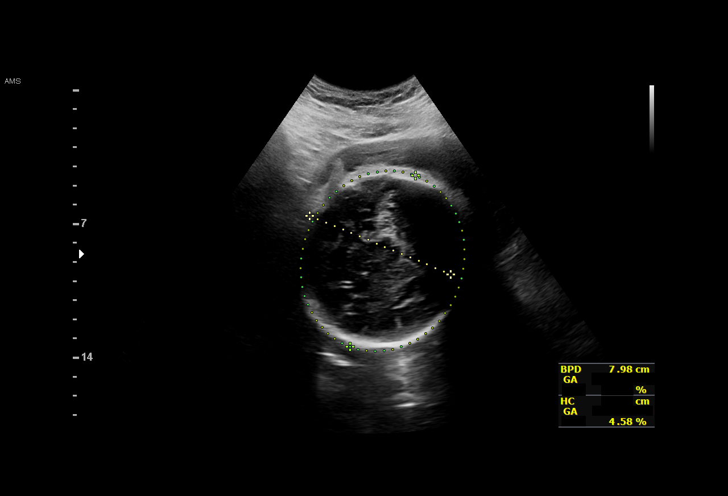
[im 5/24]
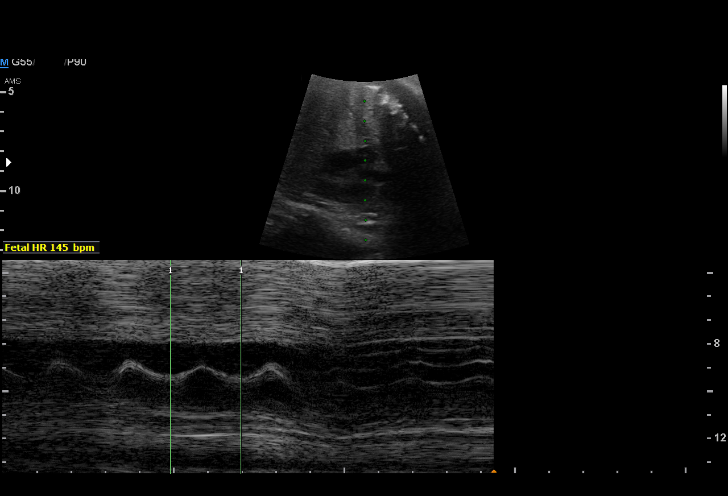
[im 7/24]
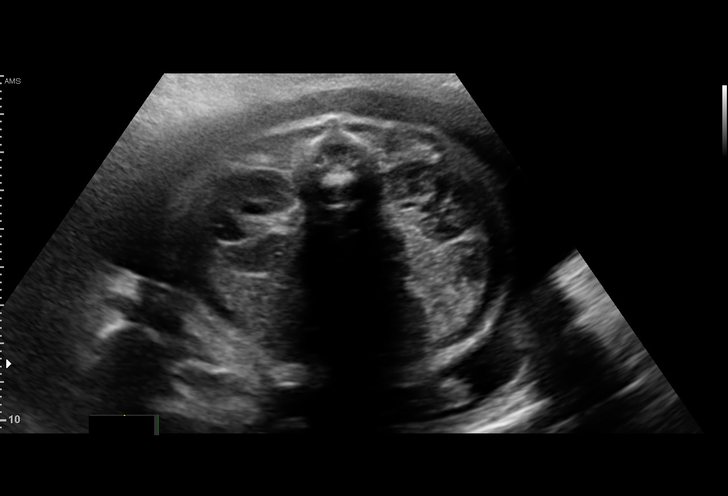
[im 9/24]
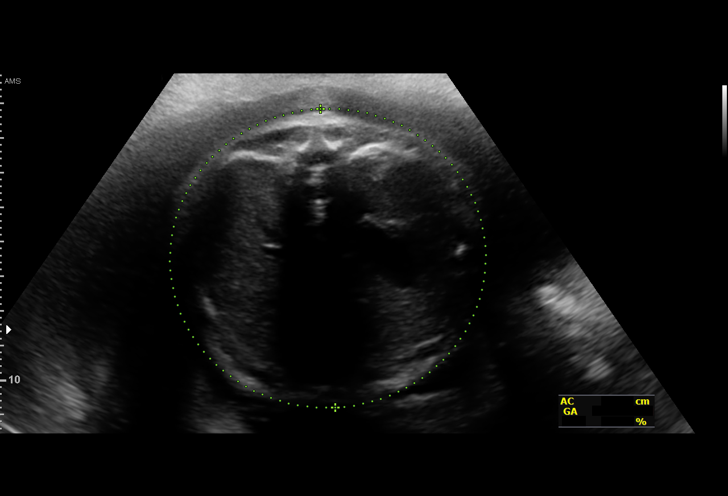
[im 11/24]
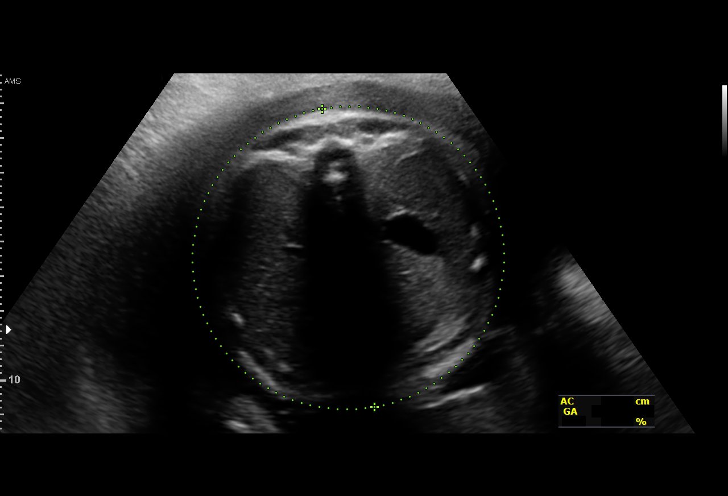
[im 13/24]
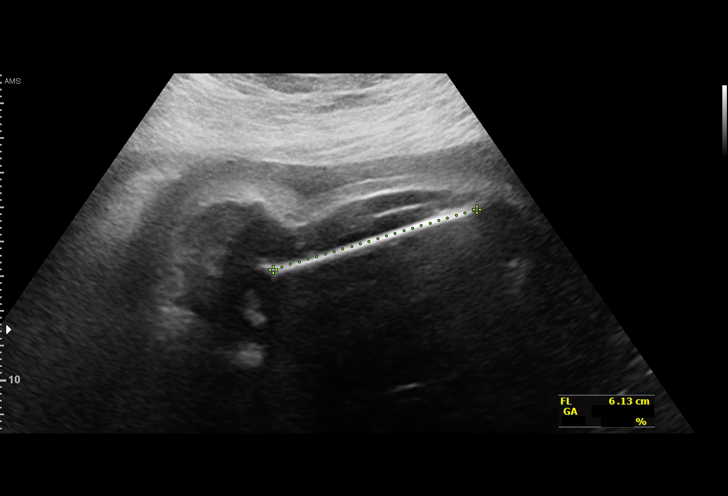
[im 14/24]
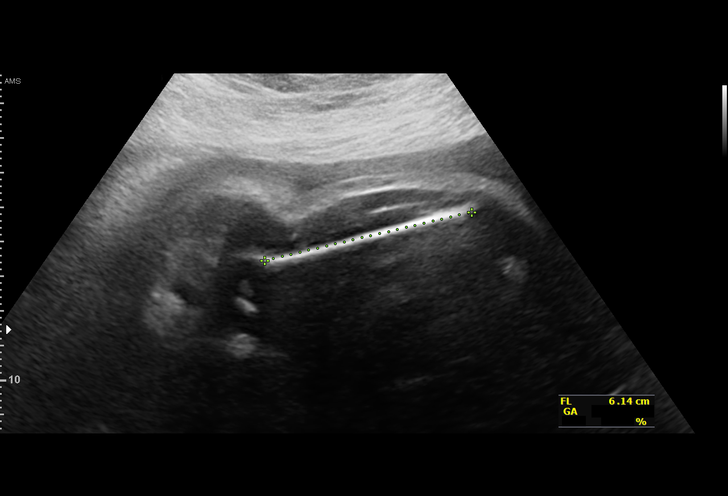
[im 16/24]
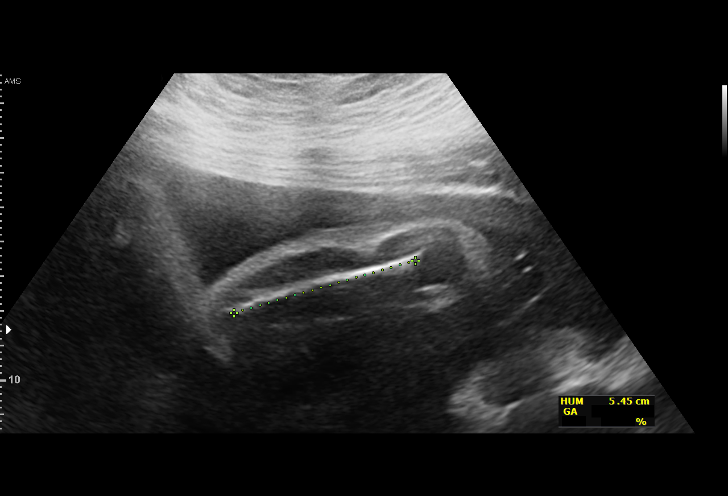
[im 18/24]
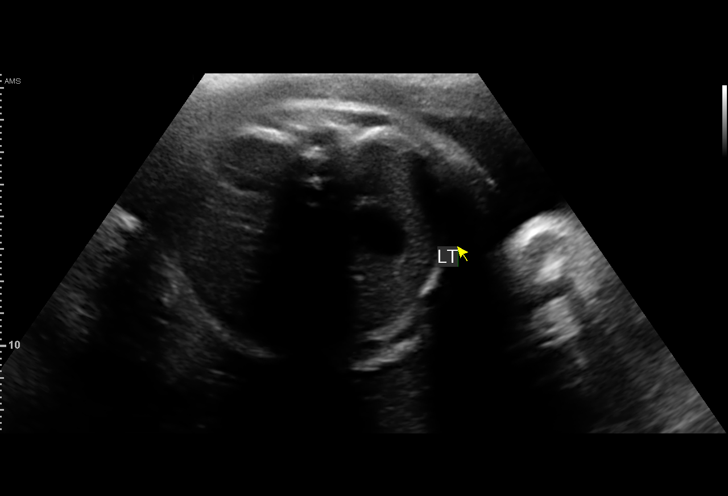
[im 20/24]
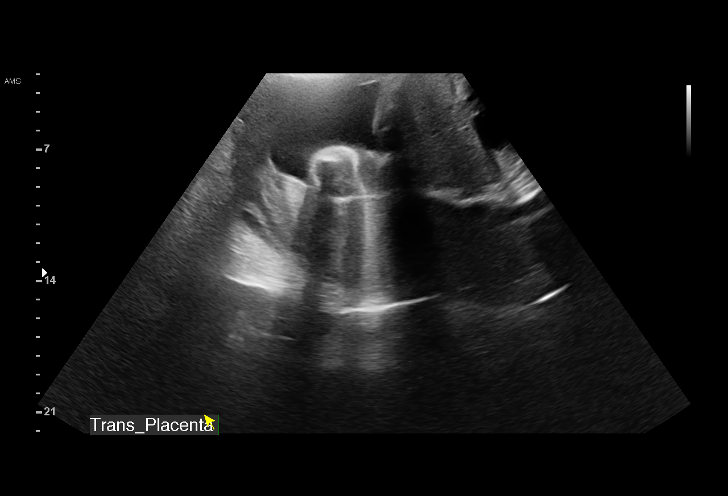
[im 22/24]
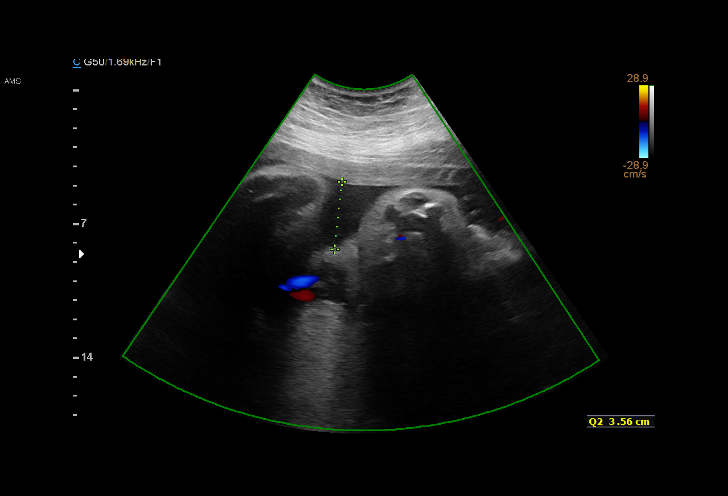
[im 24/24]
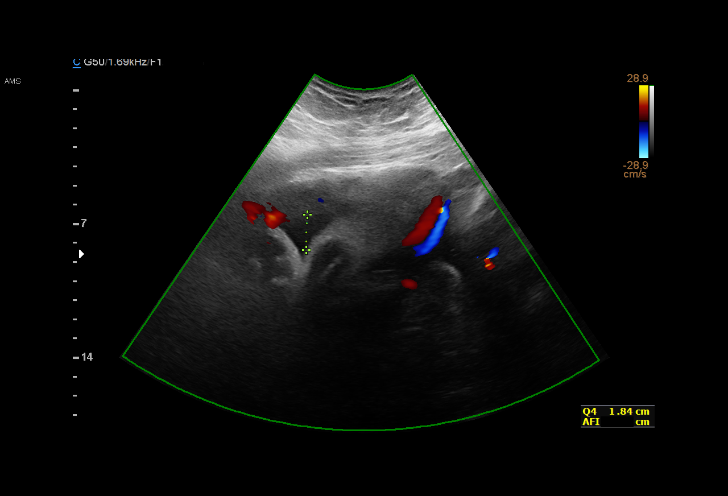

[13 of 24 positions shown; findings below may reference images not displayed]

[REDACTED]care - [HOSPITAL]

                                                       [facility]
 ----------------------------------------------------------------------

 ----------------------------------------------------------------------
Indications

  Advanced maternal age multigravida 35+,
  second trimester (low risk NIPS, AFP not
  done)
  Obesity complicating pregnancy, second
  trimester( BMI>40)
  Poor obstetric history: Previous IUFD
  (stillbirth) (one of the twins)
  31 weeks gestation of pregnancy
 ----------------------------------------------------------------------
Vital Signs

                                                Height:        5'7"
Fetal Evaluation

 Num Of Fetuses:          1
 Fetal Heart Rate(bpm):   145
 Cardiac Activity:        Observed
 Presentation:            Cephalic
 Placenta:                Posterior
 P. Cord Insertion:       Previously Visualized

 AFI Sum(cm)     %Tile       Largest Pocket(cm)
 14.89           52

 RUQ(cm)       RLQ(cm)       LUQ(cm)        LLQ(cm)

Biometry

 BPD:      78.9  mm     G. Age:  31w 5d         35  %    CI:        75.84   %    70 - 86
                                                         FL/HC:       21.4  %    19.1 -
 HC:      287.2  mm     G. Age:  31w 4d         11  %    HC/AC:       1.03       0.96 -
 AC:      278.9  mm     G. Age:  32w 0d         51  %    FL/BPD:      77.8  %    71 - 87
 FL:       61.4  mm     G. Age:  31w 6d         38  %    FL/AC:       22.0  %    20 - 24
 HUM:      54.3  mm     G. Age:  31w 4d         45  %

 Est. FW:    2226   gm     4 lb 1 oz     56  %
OB History

 Gravidity:    4         Term:   3        Prem:   0        SAB:   1
 TOP:          0        Living:  2
Gestational Age

 LMP:           31w 6d        Date:  01/06/18                 EDD:   10/13/18
 U/S Today:     31w 6d                                        EDD:   10/13/18
 Best:          31w 6d     Det. By:  LMP  (01/06/18)          EDD:   10/13/18
Anatomy

 Cranium:               Appears normal         Aortic Arch:            Previously seen
 Cavum:                 Previously seen        Ductal Arch:            Previously seen
 Ventricles:            Previously seen        Diaphragm:              Previously seen
 Choroid Plexus:        Previously seen        Stomach:                Appears normal, left
                                                                       sided
 Cerebellum:            Previously seen        Abdomen:                Appears normal
 Posterior Fossa:       Previously seen        Abdominal Wall:         Previously seen
 Nuchal Fold:           Not applicable (>20    Cord Vessels:           Previously seen
                        wks GA)
 Face:                  Orbits and profile     Kidneys:                Appear normal
                        previously seen
 Lips:                  Previously seen        Bladder:                Appears normal
 Thoracic:              Appears normal         Spine:                  Previously seen
 Heart:                 Previously seen        Upper Extremities:      Previously seen
 RVOT:                  Not well visualized    Lower Extremities:      Previously seen
 LVOT:                  Not well visualized

 Other:  Fetus appears to be a male. Heels and 5th digit prv visualized. Nasal
         bone prv visualized. Technically difficult due to advanced GA and
         fetal position.
Impression

 Normal interval growth.
 Prior IUFD of twin pregnancy
Recommendations

 Initiate weekly BPP given prior IUFD
 Follow up growth in 4 weeks.

## 2024-01-03 ENCOUNTER — Other Ambulatory Visit: Payer: Self-pay

## 2024-01-03 ENCOUNTER — Emergency Department (HOSPITAL_COMMUNITY)

## 2024-01-03 ENCOUNTER — Encounter (HOSPITAL_COMMUNITY): Payer: Self-pay | Admitting: Emergency Medicine

## 2024-01-03 ENCOUNTER — Emergency Department (HOSPITAL_COMMUNITY)
Admission: EM | Admit: 2024-01-03 | Discharge: 2024-01-03 | Disposition: A | Discharge status: Home / Self Care | Attending: Emergency Medicine | Admitting: Emergency Medicine

## 2024-01-03 DIAGNOSIS — R1032 Left lower quadrant pain: Secondary | ICD-10-CM | POA: Diagnosis present

## 2024-01-03 DIAGNOSIS — N132 Hydronephrosis with renal and ureteral calculous obstruction: Secondary | ICD-10-CM | POA: Diagnosis not present

## 2024-01-03 DIAGNOSIS — R109 Unspecified abdominal pain: Secondary | ICD-10-CM

## 2024-01-03 DIAGNOSIS — Z87891 Personal history of nicotine dependence: Secondary | ICD-10-CM | POA: Diagnosis not present

## 2024-01-03 DIAGNOSIS — N200 Calculus of kidney: Secondary | ICD-10-CM

## 2024-01-03 LAB — COMPREHENSIVE METABOLIC PANEL WITH GFR
ALT: 21 U/L (ref 0–44)
AST: 22 U/L (ref 15–41)
Albumin: 3.7 g/dL (ref 3.5–5.0)
Alkaline Phosphatase: 53 U/L (ref 38–126)
Anion gap: 7 (ref 5–15)
BUN: 21 mg/dL — ABNORMAL HIGH (ref 6–20)
CO2: 23 mmol/L (ref 22–32)
Calcium: 8.4 mg/dL — ABNORMAL LOW (ref 8.9–10.3)
Chloride: 107 mmol/L (ref 98–111)
Creatinine, Ser: 0.75 mg/dL (ref 0.44–1.00)
GFR, Estimated: >60 mL/min (ref >60)
Glucose, Bld: 112 mg/dL — ABNORMAL HIGH (ref 70–99)
Potassium: 3.5 mmol/L (ref 3.5–5.1)
Sodium: 137 mmol/L (ref 135–145)
Total Bilirubin: 0.8 mg/dL (ref 0.0–1.2)
Total Protein: 7.4 g/dL (ref 6.5–8.1)

## 2024-01-03 LAB — CBC WITH DIFFERENTIAL/PLATELET
Abs Immature Granulocytes: 0.05 K/uL (ref 0.00–0.07)
Basophils Absolute: 0 K/uL (ref 0.0–0.1)
Basophils Relative: 0 %
Eosinophils Absolute: 0 K/uL (ref 0.0–0.5)
Eosinophils Relative: 0 %
HCT: 37.7 % (ref 36.0–46.0)
Hemoglobin: 12.2 g/dL (ref 12.0–15.0)
Immature Granulocytes: 0 %
Lymphocytes Relative: 14 %
Lymphs Abs: 1.7 K/uL (ref 0.7–4.0)
MCH: 27.7 pg (ref 26.0–34.0)
MCHC: 32.4 g/dL (ref 30.0–36.0)
MCV: 85.7 fL (ref 80.0–100.0)
Monocytes Absolute: 0.7 K/uL (ref 0.1–1.0)
Monocytes Relative: 5 %
Neutro Abs: 10 K/uL — ABNORMAL HIGH (ref 1.7–7.7)
Neutrophils Relative %: 81 %
Platelets: 281 K/uL (ref 150–400)
RBC: 4.4 MIL/uL (ref 3.87–5.11)
RDW: 13.7 % (ref 11.5–15.5)
WBC: 12.5 K/uL — ABNORMAL HIGH (ref 4.0–10.5)
nRBC: 0 % (ref 0.0–0.2)

## 2024-01-03 LAB — URINALYSIS, ROUTINE W REFLEX MICROSCOPIC
Bilirubin Urine: NEGATIVE
Glucose, UA: NEGATIVE mg/dL
Hgb urine dipstick: NEGATIVE
Ketones, ur: 20 mg/dL — AB
Leukocytes,Ua: NEGATIVE
Nitrite: NEGATIVE
Protein, ur: NEGATIVE mg/dL
Specific Gravity, Urine: 1.023 (ref 1.005–1.030)
pH: 6 (ref 5.0–8.0)

## 2024-01-03 LAB — HCG, SERUM, QUALITATIVE: Preg, Serum: NEGATIVE

## 2024-01-03 LAB — LIPASE, BLOOD: Lipase: 29 U/L (ref 11–51)

## 2024-01-03 MED ORDER — IBUPROFEN 600 MG PO TABS: 600.0000 mg | ORAL_TABLET | Freq: Four times a day (QID) | ORAL | 0 refills | Status: AC | PRN

## 2024-01-03 MED ORDER — MORPHINE SULFATE (PF) 4 MG/ML IV SOLN
4.0000 mg | Freq: Once | INTRAVENOUS | Status: AC
  Administered 2024-01-03: 4 mg via INTRAVENOUS
  Filled 2024-01-03: qty 1

## 2024-01-03 MED ORDER — HYDROMORPHONE HCL 1 MG/ML IJ SOLN
1.0000 mg | Freq: Once | INTRAMUSCULAR | Status: AC
  Administered 2024-01-03: 1 mg via INTRAVENOUS
  Filled 2024-01-03: qty 1

## 2024-01-03 MED ORDER — PANTOPRAZOLE SODIUM 40 MG IV SOLR
40.0000 mg | Freq: Once | INTRAVENOUS | Status: AC
  Administered 2024-01-03: 40 mg via INTRAVENOUS
  Filled 2024-01-03: qty 10

## 2024-01-03 MED ORDER — HYDROCODONE-ACETAMINOPHEN 5-325 MG PO TABS
1.0000 | ORAL_TABLET | Freq: Once | ORAL | Status: AC
  Administered 2024-01-03: 1 via ORAL
  Filled 2024-01-03: qty 1

## 2024-01-03 MED ORDER — TAMSULOSIN HCL 0.4 MG PO CAPS: 0.4000 mg | ORAL_CAPSULE | Freq: Every day | ORAL | 0 refills | Status: AC

## 2024-01-03 MED ORDER — ONDANSETRON HCL 4 MG/2ML IJ SOLN
4.0000 mg | Freq: Once | INTRAMUSCULAR | Status: AC
  Administered 2024-01-03: 4 mg via INTRAVENOUS
  Filled 2024-01-03: qty 2

## 2024-01-03 MED ORDER — ACETAMINOPHEN 325 MG PO TABS: 650.0000 mg | ORAL_TABLET | Freq: Four times a day (QID) | ORAL | 0 refills | Status: AC | PRN

## 2024-01-03 MED ORDER — IOHEXOL 300 MG/ML  SOLN
100.0000 mL | Freq: Once | INTRAMUSCULAR | Status: AC | PRN
  Administered 2024-01-03: 100 mL via INTRAVENOUS

## 2024-01-03 MED ORDER — SODIUM CHLORIDE 0.9 % IV BOLUS
1000.0000 mL | Freq: Once | INTRAVENOUS | Status: AC
  Administered 2024-01-03: 1000 mL via INTRAVENOUS

## 2024-01-03 MED ORDER — ONDANSETRON HCL 4 MG PO TABS: 4.0000 mg | ORAL_TABLET | ORAL | 0 refills | Status: AC | PRN

## 2024-01-03 MED ORDER — KETOROLAC TROMETHAMINE 15 MG/ML IJ SOLN
15.0000 mg | Freq: Once | INTRAMUSCULAR | Status: AC
  Administered 2024-01-03: 15 mg via INTRAVENOUS
  Filled 2024-01-03: qty 1

## 2024-01-03 MED ORDER — OXYCODONE HCL 5 MG PO TABS: 5.0000 mg | ORAL_TABLET | ORAL | 0 refills | Status: AC | PRN

## 2024-01-03 MED ORDER — HYDROMORPHONE HCL 1 MG/ML IJ SOLN
0.5000 mg | Freq: Once | INTRAMUSCULAR | Status: AC
  Administered 2024-01-03: 0.5 mg via INTRAVENOUS
  Filled 2024-01-03: qty 1

## 2024-01-03 MED ORDER — DICYCLOMINE HCL 10 MG PO CAPS
10.0000 mg | ORAL_CAPSULE | Freq: Once | ORAL | Status: AC
  Administered 2024-01-03: 10 mg via ORAL
  Filled 2024-01-03: qty 1

## 2024-01-03 NOTE — ED Provider Notes (Signed)
 [place] EMERGENCY DEPARTMENT AT [facility] Provider Note  CSN: [id number] Arrival date & time: 01/03/24 9176  Chief Complaint(s) Abdominal Pain  HPI Courtney Woodward is a 43 y.o. female with past medical history as below, significant for anxiety, PIH  who presents to the ED with complaint of abd pain, diarrhea, nausea.   Patient reports she began having abdominal cramping left lower quadrant, nausea, copious amounts of diarrhea since around 6 AM this morning.  No fevers or chills, no recent travel or sick contacts.  Did have tuna salad for dinner last night from fast food restaurant.  No change in urination.  No vomiting.  No recent antibiotics, no recent hospitalization, no history of C. difficile  Past Medical History Past Medical History:  Diagnosis Date   Abnormal Pap smear of cervix 11/2017   HSIL, LEEP   Anxiety    Hx of trichomoniasis 12/2017   PIH (pregnancy induced hypertension) 2004   Patient Active Problem List   Diagnosis Date Noted   Postpartum care following vaginal delivery 09/28/2018   Contraception management 09/28/2018   History of abnormal cervical Pap smear 09/28/2018   H/O macrosomia in infant in prior pregnancy, currently pregnant 07/26/2018   Home Medication(s) Prior to Admission medications   Medication Sig Start Date End Date Taking? Authorizing Provider  acetaminophen  (TYLENOL ) 325 MG tablet Take 325 mg by mouth every 6 (six) hours as needed for moderate pain.    [provider]  calcium  carbonate (TUMS - DOSED IN MG ELEMENTAL CALCIUM ) 500 MG chewable tablet Chew 2 tablets by mouth 3 (three) times daily as needed for indigestion or heartburn.    [provider]  fluticasone (CUTIVATE) 0.05 % cream Apply 1 application topically 2 (two) times daily. 06/19/18   [provider]  ibuprofen  (ADVIL ,MOTRIN ) 600 MG tablet Take 1 tablet (600 mg total) by mouth every 6 (six) hours. 09/09/18   Marylen Aleck HERO, CNM   medroxyPROGESTERone  (DEPO-PROVERA ) 150 MG/ML injection Inject 1 mL (150 mg total) into the muscle every 3 (three) months. 09/28/18   Lorence Ozell CROME, MD  Prenatal Vit-Fe Fumarate-FA (PRENATAL MULTIVITAMIN) TABS tablet Take 1 tablet by mouth daily at 12 noon.    [provider]                                                                                                                                    Past Surgical History Past Surgical History:  Procedure Laterality Date   LEEP  11/2017   OOPHORECTOMY  2004   Rt. ovary removed--Hemorrhagic Corpus Luteum Cyst   Family History Family History  Problem Relation Age of Onset   Stroke Mother        Hx of 3 strokes   Cancer Mother        Dec unknown Cancer age 64   Diabetes Mother    Hyperlipidemia Mother     Social History Social  History   Tobacco Use   Smoking status: Former    Current packs/day: 0.00    Types: Cigarettes    Quit date: 06/07/2001    Years since quitting: 22.5   Smokeless tobacco: Never  Vaping Use   Vaping status: Never Used  Substance Use Topics   Alcohol use: Not Currently    Alcohol/week: 0.0 standard drinks of alcohol    Comment: only occ.   Drug use: No   Allergies Patient has no known allergies.  Review of Systems A thorough review of systems was obtained and all systems are negative except as noted in the HPI and PMH.   Physical Exam Vital Signs  I have reviewed the triage vital signs BP 130/77 (BP Location: Left Arm)   Pulse 77   Temp 97.6 F (36.4 C) (Oral)   Resp 16   Ht 5' 7 (1.702 m)   Wt 117.9 kg   LMP 12/06/2023   SpO2 98%   BMI 40.72 kg/m  Physical Exam Vitals and nursing note reviewed.  Constitutional:      General: She is not in acute distress.    Appearance: Normal appearance. She is well-developed. She is not ill-appearing.  HENT:     Head: Normocephalic and atraumatic.     Right Ear: External ear normal.     Left Ear: External ear normal.     Nose: Nose  normal.     Mouth/Throat:     Mouth: Mucous membranes are moist.  Eyes:     General: No scleral icterus.       Right eye: No discharge.        Left eye: No discharge.  Cardiovascular:     Rate and Rhythm: Normal rate.  Pulmonary:     Effort: Pulmonary effort is normal. No respiratory distress.     Breath sounds: No stridor.  Abdominal:     General: Abdomen is flat. There is no distension.     Tenderness: There is abdominal tenderness. There is no guarding.   Musculoskeletal:        General: No deformity.     Cervical back: No rigidity.  Skin:    General: Skin is warm and dry.     Coloration: Skin is not cyanotic, jaundiced or pale.  Neurological:     Mental Status: She is alert and oriented to person, place, and time.     GCS: GCS eye subscore is 4. GCS verbal subscore is 5. GCS motor subscore is 6.  Psychiatric:        Speech: Speech normal.        Behavior: Behavior normal. Behavior is cooperative.     ED Results and Treatments Labs (all labs ordered are listed, but only abnormal results are displayed) Labs Reviewed  CBC WITH DIFFERENTIAL/PLATELET - Abnormal; Notable for the following components:      Result Value   WBC 12.5 (*)    Neutro Abs 10.0 (*)    All other components within normal limits  COMPREHENSIVE METABOLIC PANEL WITH GFR - Abnormal; Notable for the following components:   Glucose, Bld 112 (*)    BUN 21 (*)    Calcium  8.4 (*)    All other components within normal limits  URINALYSIS, ROUTINE W REFLEX MICROSCOPIC - Abnormal; Notable for the following components:   Ketones, ur 20 (*)    All other components within normal limits  LIPASE, BLOOD  HCG, SERUM, QUALITATIVE  Radiology CT ABDOMEN PELVIS W CONTRAST Result Date: 01/03/2024 CLINICAL DATA:  Left-sided abdominal pain associated with nausea EXAM: CT ABDOMEN AND PELVIS WITH  CONTRAST TECHNIQUE: Multidetector CT imaging of the abdomen and pelvis was performed using the standard protocol following bolus administration of intravenous contrast. RADIATION DOSE REDUCTION: This exam was performed according to the departmental dose-optimization program which includes automated exposure control, adjustment of the mA and/or kV according to patient size and/or use of iterative reconstruction technique. CONTRAST:  OMNIPAQUE  IOHEXOL  300 MG/ML  SOLN COMPARISON:  None Available. FINDINGS: Lower chest: No focal consolidation or pulmonary nodule in the lung bases. No pleural effusion or pneumothorax demonstrated. Partially imaged heart size is normal. Hepatobiliary: Subcentimeter segment 2 hypodensity (2:16), too small to characterize. Hypoattenuation along the falciform ligament may reflect perfusional variation or focal steatosis. No intra or extrahepatic biliary ductal dilation. Normal gallbladder. Pancreas: No focal lesions or main ductal dilation. Spleen: Normal in size without focal abnormality. Adrenals/Urinary Tract: No adrenal nodules. Mild left hydroureteronephrosis upstream of a 4 mm left ureterovesical junction stone. Additional nonobstructing stones measuring up to 7 mm layering dependently within a left upper pole calyceal diverticulum (2:26). Asymmetric hypoenhancement of the left kidney with small volume perinephric edema. No right hydronephrosis or calculi. No focal bladder wall thickening. Stomach/Bowel: Normal appearance of the stomach. No evidence of bowel wall thickening, distention, or inflammatory changes. Normal appendix. Vascular/Lymphatic: No significant vascular findings are present. No enlarged abdominal or pelvic lymph nodes. Reproductive: No adnexal masses. Other: Small volume pelvic free fluid. No free air or fluid collection. Musculoskeletal: No acute or abnormal lytic or blastic osseous lesions. Multilevel degenerative changes of the partially imaged thoracic and  lumbar spine. IMPRESSION: 1. Mild left hydroureteronephrosis upstream of a 4 mm left ureterovesical junction stone. 2. Additional nonobstructing left nephrolithiasis. Electronically Signed   By: Limin  Xu M.D.   On: 01/03/2024 13:07    Pertinent labs & imaging results that were available during my care of the patient were reviewed by me and considered in my medical decision making (see MDM for details).  Medications Ordered in ED Medications  morphine  (PF) 4 MG/ML injection 4 mg (4 mg Intravenous Given 01/03/24 1006)  ondansetron  (ZOFRAN ) injection 4 mg (4 mg Intravenous Given 01/03/24 1000)  sodium chloride  0.9 % bolus 1,000 mL (0 mLs Intravenous Stopped 01/03/24 1140)  pantoprazole  (PROTONIX ) injection 40 mg (40 mg Intravenous Given 01/03/24 1000)  HYDROmorphone  (DILAUDID ) injection 0.5 mg (0.5 mg Intravenous Given 01/03/24 1133)  iohexol  (OMNIPAQUE ) 300 MG/ML solution 100 mL (100 mLs Intravenous Contrast Given 01/03/24 1138)  HYDROmorphone  (DILAUDID ) injection 1 mg (1 mg Intravenous Given 01/03/24 1312)  dicyclomine  (BENTYL ) capsule 10 mg (10 mg Oral Given 01/03/24 1312)  ketorolac  (TORADOL ) 15 MG/ML injection 15 mg (15 mg Intravenous Given 01/03/24 1407)  HYDROcodone -acetaminophen  (NORCO/VICODIN) 5-325 MG per tablet 1 tablet (1 tablet Oral Given 01/03/24 1408)  Procedures Procedures  (including critical care time)  Medical Decision Making / ED Course    Medical Decision Making:    Adrieana Fennelly is a 43 y.o. female with past medical history as below, significant for anxiety, PIH  who presents to the ED with complaint of abd pain, diarrhea, nausea. . The complaint involves an extensive differential diagnosis and also carries with it a high risk of complications and morbidity.  Serious etiology was considered. Ddx includes but is not limited to: Differential diagnosis  includes but is not exclusive to ectopic pregnancy, ovarian cyst, ovarian torsion, acute appendicitis, urinary tract infection, endometriosis, bowel obstruction, hernia, colitis, renal colic, gastroenteritis, volvulus etc.   Complete initial physical exam performed, notably the patient was in no acute distress, abdomen soft, tender left lower quadrant.    Reviewed and confirmed nursing documentation for past medical history, family history, social history.  Vital signs reviewed.    Left lower quad abdominal pain Nephrolithiasis> - Soft, nonperitoneal, TTP - Check labs, give fluids, get CT > labs stabe - symptoms improved on recheck - CT w/ left UVJ stone, 4mm w/ mild hydro >> pain likely 2/2 nephrolithiasis, she is voiding spontaneously, no evidence of infection, kidney function is stable.     On recheck is feeling better, she is tolerant p.o. intake without difficulty, afebrile, nontoxic.  Symptoms likely secondary to nephrolithiasis, start analgesics, Flomax , urine strainer, encourage rehydration, follow-up with urology if symptoms persist  Patient in no distress and overall condition is stable. Detailed discussions were had with the patient/guardian regarding current findings, and need for close f/u with PCP or on call doctor. The patient/guardian has been instructed to return immediately if the symptoms worsen in any way for re-evaluation. Patient/guardian verbalized understanding and is in agreement with current care plan. All questions answered prior to discharge.                Additional history obtained: -Additional history obtained from son -External records from outside source obtained and reviewed including: Chart review including previous notes, labs, imaging, consultation notes including  Primary care documentation, home medications   Lab Tests: -I ordered, reviewed, and interpreted labs.   The pertinent results include:   Labs Reviewed  CBC WITH  DIFFERENTIAL/PLATELET - Abnormal; Notable for the following components:      Result Value   WBC 12.5 (*)    Neutro Abs 10.0 (*)    All other components within normal limits  COMPREHENSIVE METABOLIC PANEL WITH GFR - Abnormal; Notable for the following components:   Glucose, Bld 112 (*)    BUN 21 (*)    Calcium  8.4 (*)    All other components within normal limits  URINALYSIS, ROUTINE W REFLEX MICROSCOPIC - Abnormal; Notable for the following components:   Ketones, ur 20 (*)    All other components within normal limits  LIPASE, BLOOD  HCG, SERUM, QUALITATIVE    Notable for labs stable  EKG   EKG Interpretation Date/Time:    Ventricular Rate:    PR Interval:    QRS Duration:    QT Interval:    QTC Calculation:   R Axis:      Text Interpretation:           Imaging Studies ordered: I ordered imaging studies including CTAP I independently visualized the following imaging with scope of interpretation limited to determining acute life threatening conditions related to emergency care; findings noted above I agree with the radiologist interpretation If any imaging was obtained  with contrast I closely monitored patient for any possible adverse reaction a/w contrast administration in the emergency department   Medicines ordered and prescription drug management: Meds ordered this encounter  Medications   morphine  (PF) 4 MG/ML injection 4 mg   ondansetron  (ZOFRAN ) injection 4 mg   sodium chloride  0.9 % bolus 1,000 mL   pantoprazole  (PROTONIX ) injection 40 mg   HYDROmorphone  (DILAUDID ) injection 0.5 mg   iohexol  (OMNIPAQUE ) 300 MG/ML solution 100 mL   HYDROmorphone  (DILAUDID ) injection 1 mg   dicyclomine  (BENTYL ) capsule 10 mg   ketorolac  (TORADOL ) 15 MG/ML injection 15 mg   HYDROcodone -acetaminophen  (NORCO/VICODIN) 5-325 MG per tablet 1 tablet    Refill:  0    -I have reviewed the patients home medicines and have made adjustments as needed   Consultations  Obtained: na   Cardiac Monitoring: Continuous pulse oximetry interpreted by myself, 98% on ra.    Social Determinants of Health:  Diagnosis or treatment significantly limited by social determinants of health: former smoker and obesity   Reevaluation: After the interventions noted above, I reevaluated the patient and found that they have improved  Co morbidities that complicate the patient evaluation  Past Medical History:  Diagnosis Date   Abnormal Pap smear of cervix 11/2017   HSIL, LEEP   Anxiety    Hx of trichomoniasis 12/2017   PIH (pregnancy induced hypertension) 2004      Dispostion: Disposition decision including need for hospitalization was considered, and patient discharged from emergency department.    Final Clinical Impression(s) / ED Diagnoses Final diagnoses:  Nephrolithiasis  Abdominal pain, unspecified abdominal location        Elnor Jayson LABOR, DO 01/03/24 1445

## 2024-01-03 NOTE — Discharge Instructions (Addendum)
 It was a pleasure caring for you today in the emergency department.  You have a 4 mm kidney stone that is likely the source of your pain.  You have been started on medications to help control the pain and help encourage the kidney stone to pass.  Be sure to drink plenty of fluids.  Follow-up with urology if symptoms do not improve  Please return to the emergency department for any worsening or worrisome symptoms.

## 2024-01-03 NOTE — ED Triage Notes (Signed)
 Pt comes in with left sided abdominal pain that started 6am. Last bowel movement was 2 am. Nausea at this moment.

## 2024-01-03 NOTE — ED Notes (Signed)
 Pt stated that she is unable to urinate.   Will try again later.

## 2024-01-05 ENCOUNTER — Emergency Department (HOSPITAL_BASED_OUTPATIENT_CLINIC_OR_DEPARTMENT_OTHER)
Admission: EM | Admit: 2024-01-05 | Discharge: 2024-01-06 | Disposition: A | Discharge status: Home / Self Care | Attending: Emergency Medicine | Admitting: Emergency Medicine

## 2024-01-05 ENCOUNTER — Emergency Department (HOSPITAL_BASED_OUTPATIENT_CLINIC_OR_DEPARTMENT_OTHER): Admitting: Radiology

## 2024-01-05 ENCOUNTER — Other Ambulatory Visit: Payer: Self-pay

## 2024-01-05 ENCOUNTER — Encounter (HOSPITAL_BASED_OUTPATIENT_CLINIC_OR_DEPARTMENT_OTHER): Payer: Self-pay

## 2024-01-05 ENCOUNTER — Emergency Department (HOSPITAL_BASED_OUTPATIENT_CLINIC_OR_DEPARTMENT_OTHER)

## 2024-01-05 DIAGNOSIS — R509 Fever, unspecified: Secondary | ICD-10-CM | POA: Diagnosis present

## 2024-01-05 DIAGNOSIS — N12 Tubulo-interstitial nephritis, not specified as acute or chronic: Secondary | ICD-10-CM | POA: Insufficient documentation

## 2024-01-05 DIAGNOSIS — R0602 Shortness of breath: Secondary | ICD-10-CM | POA: Diagnosis not present

## 2024-01-05 LAB — URINALYSIS, ROUTINE W REFLEX MICROSCOPIC
Bilirubin Urine: NEGATIVE
Glucose, UA: NEGATIVE mg/dL
Ketones, ur: >80 mg/dL — AB
Nitrite: NEGATIVE
Protein, ur: 100 mg/dL — AB
Specific Gravity, Urine: 1.025 (ref 1.005–1.030)
pH: 6 (ref 5.0–8.0)

## 2024-01-05 LAB — BASIC METABOLIC PANEL WITH GFR
Anion gap: 13 (ref 5–15)
BUN: 8 mg/dL (ref 6–20)
CO2: 21 mmol/L — ABNORMAL LOW (ref 22–32)
Calcium: 9.3 mg/dL (ref 8.9–10.3)
Chloride: 100 mmol/L (ref 98–111)
Creatinine, Ser: 0.74 mg/dL (ref 0.44–1.00)
GFR, Estimated: >60 mL/min (ref >60)
Glucose, Bld: 144 mg/dL — ABNORMAL HIGH (ref 70–99)
Potassium: 3.4 mmol/L — ABNORMAL LOW (ref 3.5–5.1)
Sodium: 134 mmol/L — ABNORMAL LOW (ref 135–145)

## 2024-01-05 LAB — CBC
HCT: 37.8 % (ref 36.0–46.0)
Hemoglobin: 12.6 g/dL (ref 12.0–15.0)
MCH: 27.7 pg (ref 26.0–34.0)
MCHC: 33.3 g/dL (ref 30.0–36.0)
MCV: 83.1 fL (ref 80.0–100.0)
Platelets: 248 K/uL (ref 150–400)
RBC: 4.55 MIL/uL (ref 3.87–5.11)
RDW: 13.6 % (ref 11.5–15.5)
WBC: 12.7 K/uL — ABNORMAL HIGH (ref 4.0–10.5)
nRBC: 0 % (ref 0.0–0.2)

## 2024-01-05 LAB — TROPONIN T, HIGH SENSITIVITY
Troponin T High Sensitivity: <15 ng/L (ref <19)
Troponin T High Sensitivity: <15 ng/L (ref <19)

## 2024-01-05 LAB — RESP PANEL BY RT-PCR (RSV, FLU A&B, COVID)  RVPGX2
Influenza A by PCR: NEGATIVE
Influenza B by PCR: NEGATIVE
Resp Syncytial Virus by PCR: NEGATIVE
SARS Coronavirus 2 by RT PCR: NEGATIVE

## 2024-01-05 LAB — LACTIC ACID, PLASMA: Lactic Acid, Venous: 1.8 mmol/L (ref 0.5–1.9)

## 2024-01-05 LAB — PREGNANCY, URINE: Preg Test, Ur: NEGATIVE

## 2024-01-05 MED ORDER — ONDANSETRON HCL 4 MG/2ML IJ SOLN
4.0000 mg | Freq: Once | INTRAMUSCULAR | Status: AC
  Administered 2024-01-05: 4 mg via INTRAVENOUS
  Filled 2024-01-05: qty 2

## 2024-01-05 MED ORDER — POTASSIUM CHLORIDE CRYS ER 20 MEQ PO TBCR
30.0000 meq | EXTENDED_RELEASE_TABLET | Freq: Once | ORAL | Status: AC
  Administered 2024-01-05: 30 meq via ORAL

## 2024-01-05 MED ORDER — LACTATED RINGERS IV BOLUS
1000.0000 mL | Freq: Once | INTRAVENOUS | Status: AC
  Administered 2024-01-05: 1000 mL via INTRAVENOUS

## 2024-01-05 MED ORDER — SODIUM CHLORIDE 0.9 % IV SOLN
1.0000 g | Freq: Once | INTRAVENOUS | Status: AC
  Administered 2024-01-05: 1 g via INTRAVENOUS
  Filled 2024-01-05: qty 10

## 2024-01-05 MED ORDER — ACETAMINOPHEN 500 MG PO TABS
1000.0000 mg | ORAL_TABLET | Freq: Once | ORAL | Status: AC
  Administered 2024-01-05: 1000 mg via ORAL
  Filled 2024-01-05: qty 2

## 2024-01-05 NOTE — ED Provider Notes (Incomplete)
  EMERGENCY DEPARTMENT AT [facility] Provider Note   CSN: [id number] Arrival date & time: 01/05/24  1941     Patient presents with: Fever and Shortness of Breath   Courtney Woodward is a 43 y.o. female.  {Add pertinent medical, surgical, social history, OB history to HPI:32947} HPI     43yo female with history of anxiety diagnosis of left UVJ kidney stone 2 days ago presents with concern for nausea, vomiting and fever.  Reports she has had nausea and vomiting since Tuesday before she was diagnosed with the kidney stone.   Her nausea and vomiting have continued, however her abdominal pain and flank pain have improved.  She is felt short of breath.  Denies any cough.  Denies any diarrhea, constipation, dysuria.  Feels generally weak.  She did not check her temperature at home but she felt feverish.  Denies other infectious symptoms including cough, sore throat.  Denies chest pain.  Reports more diffuse body aches.   Past Medical History:  Diagnosis Date  . 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 Anxiety   . Hx of trichomoniasis 12/2017  . PIH (pregnancy induced hypertension) 2004    Prior to Admission medications   Medication Sig Start Date End Date Taking? Authorizing Provider  acetaminophen  (TYLENOL ) 325 MG tablet Take 2 tablets (650 mg total) by mouth every 6 (six) hours as needed. 01/03/24   Elnor Jayson LABOR, DO  calcium  carbonate (TUMS - DOSED IN MG ELEMENTAL CALCIUM ) 500 MG chewable tablet Chew 2 tablets by mouth 3 (three) times daily as needed for indigestion or heartburn.    [provider]  fluticasone (CUTIVATE) 0.05 % cream Apply 1 application topically 2 (two) times daily. 06/19/18   [provider]  ibuprofen  (ADVIL ) 600 MG tablet Take 1 tablet (600 mg total) by mouth every 6 (six) hours as needed. 01/03/24   Elnor Jayson LABOR, DO  medroxyPROGESTERone  (DEPO-PROVERA ) 150 MG/ML injection Inject 1 mL (150 mg total) into the muscle  every 3 (three) months. 09/28/18   Ervin, Michael L, MD  ondansetron  (ZOFRAN ) 4 MG tablet Take 1 tablet (4 mg total) by mouth every 4 (four) hours as needed for nausea or vomiting. 01/03/24   Elnor Jayson LABOR, DO  oxyCODONE  (ROXICODONE ) 5 MG immediate release tablet Take 1 tablet (5 mg total) by mouth every 4 (four) hours as needed for severe pain (pain score 7-10). 01/03/24   Elnor Jayson LABOR, DO  Prenatal Vit-Fe Fumarate-FA (PRENATAL MULTIVITAMIN) TABS tablet Take 1 tablet by mouth daily at 12 noon.    [provider]  tamsulosin  (FLOMAX ) 0.4 MG CAPS capsule Take 1 capsule (0.4 mg total) by mouth daily for 14 days. 01/03/24 01/17/24  Elnor Jayson LABOR, DO    Allergies: Patient has no known allergies.    Review of Systems  Updated Vital Signs BP (!) 104/59   Pulse 85   Temp (!) 100.9 F (38.3 C) (Oral)   Resp 20   LMP 12/06/2023   SpO2 97%   Physical Exam  (all labs ordered are listed, but only abnormal results are displayed) Labs Reviewed  BASIC METABOLIC PANEL WITH GFR - Abnormal; Notable for the following components:      Result Value   Sodium 134 (*)    Potassium 3.4 (*)    CO2 21 (*)    Glucose, Bld 144 (*)    All other components within normal limits  CBC - Abnormal; Notable for the following  components:   WBC 12.7 (*)    All other components within normal limits  URINALYSIS, ROUTINE W REFLEX MICROSCOPIC - Abnormal; Notable for the following components:   APPearance HAZY (*)    Hgb urine dipstick MODERATE (*)    Ketones, ur >80 (*)    Protein, ur 100 (*)    Leukocytes,Ua MODERATE (*)    Bacteria, UA MANY (*)    All other components within normal limits  RESP PANEL BY RT-PCR (RSV, FLU A&B, COVID)  [id number]  CULTURE, BLOOD (ROUTINE X 2)  CULTURE, BLOOD (ROUTINE X 2)  PREGNANCY, URINE  LACTIC ACID, PLASMA  LACTIC ACID, PLASMA  TROPONIN T, HIGH SENSITIVITY  TROPONIN T, HIGH SENSITIVITY    EKG: EKG Interpretation Date/Time:  Thursday January 05 2024 19:50:48  EDT Ventricular Rate:  100 PR Interval:  110 QRS Duration:  80 QT Interval:  366 QTC Calculation: 472 R Axis:   81  Text Interpretation: Sinus rhythm with short PR ST & T wave abnormality, consider inferior ischemia Abnormal ECG When compared with ECG of 05-Jan-2018 20:06, PREVIOUS ECG IS PRESENT Confirmed by Dreama Longs ([id number]) on 01/05/2024 10:16:37 PM  Radiology: [facility] Chest 2 View Result Date: 01/05/2024 CLINICAL DATA:  Shortness of breath and fevers. EXAM: CHEST - 2 VIEW COMPARISON:  01/05/2018 FINDINGS: Shallow inspiration with linear atelectasis in the right base. No airspace disease or consolidation in the lungs. No pleural effusion or pneumothorax. Mediastinal contours appear intact. Heart size and pulmonary vascularity are normal. Degenerative changes in the spine. IMPRESSION: Shallow inspiration with linear atelectasis in the right base. Electronically Signed   By: Elsie Gravely M.D.   On: 01/05/2024 20:05    {Document cardiac monitor, telemetry assessment procedure when appropriate:32947} Procedures   Medications Ordered in the ED  cefTRIAXone  (ROCEPHIN ) 1 g in sodium chloride  0.9 % 100 mL IVPB (1 g Intravenous New Bag/Given 01/05/24 2213)  ondansetron  (ZOFRAN ) injection 4 mg (has no administration in time range)  lactated ringers  bolus 1,000 mL (has no administration in time range)  acetaminophen  (TYLENOL ) tablet 1,000 mg (1,000 mg Oral Given 01/05/24 2046)      {Click here for ABCD2, HEART and other calculators REFRESH Note before signing:1}                               43yo female with history of anxiety diagnosis of left UVJ kidney stone 2 days ago presents with concern for nausea, vomiting and fever.  Differential diagnosis includes anemia, electrolyte abnormalities, sepsis, infected kidney stone, and with shortness of breath consider CHF, pneumonia, PE.  Given diagnosis of kidney stone on the 29th with development now of fever and concern for urinary tract  infection infected stone, CT stone study was repeated today.  CT stone study shows stranding and edema around the left kidney and left ureter improved since prior study, her residual left ureteral stone has passed in the interval.  There are nonobstructing intrarenal stones of the left, small amount of free fluid which is likely physiologic or reactive and similar to prior study.   Labs completed and personally eval and interpreted by me show a urinalysis consistent with urinary tract infection with many bacteria  {Document critical care time when appropriate  Document review of labs and clinical decision tools ie CHADS2VASC2, etc  Document your independent review of radiology images and any outside records  Document your discussion with family members, caretakers and with consultants  Document  social determinants of health affecting pt's care  Document your decision making why or why not admission, treatments were needed:32947:::1}   Final diagnoses:  None    ED Discharge Orders     None

## 2024-01-05 NOTE — ED Provider Notes (Signed)
 [place] [facility] AT [facility] Provider Note   CSN: [id number] Arrival date & time: 01/05/24  1941     Patient presents with: Fever and Shortness of Breath   Courtney Woodward is a 43 y.o. female.  {Add pertinent medical, surgical, social history, OB history to HPI:32947} HPI     Prior to Admission medications   Medication Sig Start Date End Date Taking? Authorizing Provider  acetaminophen  (TYLENOL ) 325 MG tablet Take 2 tablets (650 mg total) by mouth every 6 (six) hours as needed. 01/03/24   Elnor Jayson LABOR, DO  calcium  carbonate (TUMS - DOSED IN MG ELEMENTAL CALCIUM ) 500 MG chewable tablet Chew 2 tablets by mouth 3 (three) times daily as needed for indigestion or heartburn.    [provider]  fluticasone (CUTIVATE) 0.05 % cream Apply 1 application topically 2 (two) times daily. 06/19/18   [provider]  ibuprofen  (ADVIL ) 600 MG tablet Take 1 tablet (600 mg total) by mouth every 6 (six) hours as needed. 01/03/24   Elnor Jayson LABOR, DO  medroxyPROGESTERone  (DEPO-PROVERA ) 150 MG/ML injection Inject 1 mL (150 mg total) into the muscle every 3 (three) months. 09/28/18   Ervin, Michael L, MD  ondansetron  (ZOFRAN ) 4 MG tablet Take 1 tablet (4 mg total) by mouth every 4 (four) hours as needed for nausea or vomiting. 01/03/24   Elnor Jayson LABOR, DO  oxyCODONE  (ROXICODONE ) 5 MG immediate release tablet Take 1 tablet (5 mg total) by mouth every 4 (four) hours as needed for severe pain (pain score 7-10). 01/03/24   Elnor Jayson LABOR, DO  Prenatal Vit-Fe Fumarate-FA (PRENATAL MULTIVITAMIN) TABS tablet Take 1 tablet by mouth daily at 12 noon.    [provider]  tamsulosin  (FLOMAX ) 0.4 MG CAPS capsule Take 1 capsule (0.4 mg total) by mouth daily for 14 days. 01/03/24 01/17/24  Elnor Jayson LABOR, DO    Allergies: Patient has no known allergies.    Review of Systems  Updated Vital Signs BP (!) 104/59   Pulse 85   Temp (!) 100.9 F (38.3 C) (Oral)   Resp 20    LMP 12/06/2023   SpO2 97%   Physical Exam  (all labs ordered are listed, but only abnormal results are displayed) Labs Reviewed  BASIC METABOLIC PANEL WITH GFR - Abnormal; Notable for the following components:      Result Value   Sodium 134 (*)    Potassium 3.4 (*)    CO2 21 (*)    Glucose, Bld 144 (*)    All other components within normal limits  CBC - Abnormal; Notable for the following components:   WBC 12.7 (*)    All other components within normal limits  URINALYSIS, ROUTINE W REFLEX MICROSCOPIC - Abnormal; Notable for the following components:   APPearance HAZY (*)    Hgb urine dipstick MODERATE (*)    Ketones, ur >80 (*)    Protein, ur 100 (*)    Leukocytes,Ua MODERATE (*)    Bacteria, UA MANY (*)    All other components within normal limits  RESP PANEL BY RT-PCR (RSV, FLU A&B, COVID)  [id number]  CULTURE, BLOOD (ROUTINE X 2)  CULTURE, BLOOD (ROUTINE X 2)  PREGNANCY, URINE  LACTIC ACID, PLASMA  LACTIC ACID, PLASMA  TROPONIN T, HIGH SENSITIVITY  TROPONIN T, HIGH SENSITIVITY    EKG: EKG Interpretation Date/Time:  Thursday January 05 2024 19:50:48 EDT Ventricular Rate:  100 PR Interval:  110 QRS Duration:  80 QT Interval:  366  QTC Calculation: 472 R Axis:   81  Text Interpretation: Sinus rhythm with short PR ST & T wave abnormality, consider inferior ischemia Abnormal ECG When compared with ECG of 05-Jan-2018 20:06, PREVIOUS ECG IS PRESENT Confirmed by Dreama Longs ([id number]) on 01/05/2024 10:16:37 PM  Radiology: [facility] Chest 2 View Result Date: 01/05/2024 CLINICAL DATA:  Shortness of breath and fevers. EXAM: CHEST - 2 VIEW COMPARISON:  01/05/2018 FINDINGS: Shallow inspiration with linear atelectasis in the right base. No airspace disease or consolidation in the lungs. No pleural effusion or pneumothorax. Mediastinal contours appear intact. Heart size and pulmonary vascularity are normal. Degenerative changes in the spine. IMPRESSION: Shallow inspiration with linear  atelectasis in the right base. Electronically Signed   By: Elsie Gravely M.D.   On: 01/05/2024 20:05    {Document cardiac monitor, telemetry assessment procedure when appropriate:32947} Procedures   Medications Ordered in the ED  cefTRIAXone  (ROCEPHIN ) 1 g in sodium chloride  0.9 % 100 mL IVPB (1 g Intravenous New Bag/Given 01/05/24 2213)  ondansetron  (ZOFRAN ) injection 4 mg (has no administration in time range)  lactated ringers  bolus 1,000 mL (has no administration in time range)  acetaminophen  (TYLENOL ) tablet 1,000 mg (1,000 mg Oral Given 01/05/24 2046)      {Click here for ABCD2, HEART and other calculators REFRESH Note before signing:1}                              Medical Decision Making Amount and/or Complexity of Data Reviewed Labs: ordered. Radiology: ordered.  Risk OTC drugs. Prescription drug management.   ***  {Document critical care time when appropriate  Document review of labs and clinical decision tools ie CHADS2VASC2, etc  Document your independent review of radiology images and any outside records  Document your discussion with family members, caretakers and with consultants  Document social determinants of health affecting pt's care  Document your decision making why or why not admission, treatments were needed:32947:::1}   Final diagnoses:  None    ED Discharge Orders     None

## 2024-01-05 NOTE — ED Notes (Signed)
 RT assessed pt in triage for SOB. Pt respiratory status stable w/no distress noted. Pt BLBS cl/cl dim at time of assessment.    01/05/24 1955  Therapy Vitals  Pulse Rate 99  Resp 18  Patient Position (if appropriate) Sitting  MEWS Score/Color  MEWS Score 1  MEWS Score Color Green  Respiratory Assessment  Assessment Type Assess only  Respiratory Pattern Regular;Unlabored;Symmetrical  Chest Assessment Chest expansion symmetrical  Cough None  Bilateral Breath Sounds Clear;Diminished  R Upper  Breath Sounds Clear  L Upper Breath Sounds Clear  R Lower Breath Sounds Clear;Diminished  L Lower Breath Sounds Clear;Diminished  Oxygen Therapy/Pulse Ox  O2 Device Room Air  O2 Therapy Room air  SpO2 98 %

## 2024-01-05 NOTE — ED Triage Notes (Signed)
 Pt reports she is here today due to sob, fevers. Pt reports unknown temps at home. Pt reports recent dx of kidney stone. Pt reports she is on meds for kidney stone and unable to eat or drink. Pt reports x2 days of N&V.

## 2024-01-06 MED ORDER — CEPHALEXIN 500 MG PO CAPS: 500.0000 mg | ORAL_CAPSULE | Freq: Three times a day (TID) | ORAL | 0 refills | Status: AC

## 2024-01-06 MED ORDER — ONDANSETRON 4 MG PO TBDP: 4.0000 mg | ORAL_TABLET | Freq: Three times a day (TID) | ORAL | 0 refills | Status: DC | PRN

## 2024-01-06 MED ORDER — CEPHALEXIN 500 MG PO CAPS: 500.0000 mg | ORAL_CAPSULE | Freq: Three times a day (TID) | ORAL | 0 refills | Status: DC

## 2024-01-06 MED ORDER — ONDANSETRON 4 MG PO TBDP: 4.0000 mg | ORAL_TABLET | Freq: Three times a day (TID) | ORAL | 0 refills | Status: AC | PRN

## 2024-01-07 LAB — URINE CULTURE

## 2024-01-11 LAB — CULTURE, BLOOD (ROUTINE X 2)
Culture: NO GROWTH
Culture: NO GROWTH
Special Requests: ADEQUATE
Special Requests: ADEQUATE
